# Patient Record
Sex: Female | Born: 1940 | Race: White | Hispanic: No | Marital: Married | Smoking: Never smoker
Health system: Southern US, Community
[De-identification: ages and names within clinical notes are randomized; demographics above are authoritative.]

## PROBLEM LIST (undated history)

## (undated) DIAGNOSIS — G20 Parkinson's disease: Secondary | ICD-10-CM

## (undated) DIAGNOSIS — G629 Polyneuropathy, unspecified: Secondary | ICD-10-CM

## (undated) DIAGNOSIS — R251 Tremor, unspecified: Secondary | ICD-10-CM

## (undated) DIAGNOSIS — H9390 Unspecified disorder of ear, unspecified ear: Secondary | ICD-10-CM

## (undated) DIAGNOSIS — IMO0002 Reserved for concepts with insufficient information to code with codable children: Secondary | ICD-10-CM

## (undated) DIAGNOSIS — H9190 Unspecified hearing loss, unspecified ear: Secondary | ICD-10-CM

## (undated) DIAGNOSIS — G20A1 Parkinson's disease without dyskinesia, without mention of fluctuations: Secondary | ICD-10-CM

## (undated) HISTORY — DX: Unspecified hearing loss, unspecified ear: H91.90

## (undated) HISTORY — DX: Reserved for concepts with insufficient information to code with codable children: IMO0002

## (undated) HISTORY — DX: Unspecified disorder of ear, unspecified ear: H93.90

## (undated) HISTORY — DX: Tremor, unspecified: R25.1

## (undated) HISTORY — DX: Polyneuropathy, unspecified: G62.9

---

## 1998-01-10 ENCOUNTER — Other Ambulatory Visit: Admission: RE | Admit: 1998-01-10 | Discharge: 1998-01-10 | Payer: Self-pay | Admitting: Obstetrics and Gynecology

## 1998-07-19 ENCOUNTER — Other Ambulatory Visit: Admission: RE | Admit: 1998-07-19 | Discharge: 1998-07-19 | Payer: Self-pay | Admitting: Radiology

## 1998-07-26 ENCOUNTER — Ambulatory Visit (HOSPITAL_BASED_OUTPATIENT_CLINIC_OR_DEPARTMENT_OTHER): Admission: RE | Admit: 1998-07-26 | Discharge: 1998-07-26 | Payer: Self-pay

## 2000-07-04 ENCOUNTER — Inpatient Hospital Stay (HOSPITAL_COMMUNITY): Admission: EM | Admit: 2000-07-04 | Discharge: 2000-07-14 | Payer: Self-pay | Admitting: *Deleted

## 2000-07-16 ENCOUNTER — Other Ambulatory Visit: Admission: RE | Admit: 2000-07-16 | Discharge: 2000-07-17 | Payer: Self-pay | Admitting: *Deleted

## 2000-07-17 ENCOUNTER — Inpatient Hospital Stay (HOSPITAL_COMMUNITY): Admission: EM | Admit: 2000-07-17 | Discharge: 2000-07-22 | Payer: Self-pay | Admitting: *Deleted

## 2000-07-23 ENCOUNTER — Encounter: Payer: Self-pay | Admitting: Emergency Medicine

## 2000-07-23 ENCOUNTER — Inpatient Hospital Stay: Admission: EM | Admit: 2000-07-23 | Discharge: 2000-07-29 | Payer: Self-pay | Admitting: Emergency Medicine

## 2000-07-23 ENCOUNTER — Encounter: Payer: Self-pay | Admitting: Endocrinology

## 2000-07-24 ENCOUNTER — Encounter: Payer: Self-pay | Admitting: Endocrinology

## 2000-07-26 ENCOUNTER — Encounter: Payer: Self-pay | Admitting: Internal Medicine

## 2003-02-17 ENCOUNTER — Encounter (INDEPENDENT_AMBULATORY_CARE_PROVIDER_SITE_OTHER): Payer: Self-pay | Admitting: Specialist

## 2003-02-17 ENCOUNTER — Ambulatory Visit (HOSPITAL_COMMUNITY): Admission: RE | Admit: 2003-02-17 | Discharge: 2003-02-17 | Payer: Self-pay | Admitting: Gastroenterology

## 2006-07-09 ENCOUNTER — Encounter: Admission: RE | Admit: 2006-07-09 | Discharge: 2006-07-23 | Payer: Self-pay | Admitting: Neurology

## 2008-12-20 ENCOUNTER — Encounter: Admission: RE | Admit: 2008-12-20 | Discharge: 2008-12-20 | Payer: Self-pay | Admitting: Neurology

## 2009-07-11 ENCOUNTER — Emergency Department (HOSPITAL_BASED_OUTPATIENT_CLINIC_OR_DEPARTMENT_OTHER): Admission: EM | Admit: 2009-07-11 | Discharge: 2009-07-11 | Payer: Self-pay | Admitting: Emergency Medicine

## 2009-07-11 ENCOUNTER — Ambulatory Visit: Payer: Self-pay | Admitting: Diagnostic Radiology

## 2011-02-22 NOTE — H&P (Signed)
[facility]  Patient:    Kristina Luna, Kristina Luna                         MRN: [id number] Adm. Date:  [id number] Attending:  Rogelia Boga                         History and Physical  HISTORY OF PRESENT ILLNESS:  The patient is a 70 year old white female who was admitted to the [facility] on July 17, 2000, for evaluation and treatment of bipolar disorder.  At that time she was admitted on a voluntary basis due to increasingly bizarre behavior, extreme restlessness with inability to sleep.  Prior to that admission the patient had become much more restless and unable to sit still.  She had become more anxious and irritable prior to this admission.  She was initially evaluated and stabilized at [facility] ED where she received Benadryl, Ativan, Haldol, and Cogentin.  PAST PSYCHIATRIC HISTORY:  The patient has had one previous psychiatric admission at the [facility] from September 28 through October 8 and again was admitted three days later.  Dr. Kevan Ny has been her primary care physician.  PAST MEDICAL HISTORY:  Remarkable for a history of lumbar disk disease and remote hysterectomy.  MEDICATIONS:  Prior to her July 17, 2000, admission the patient had been on Haldol, Depakote, Cogentin, Klonopin.  ALLERGIES:  Drug allergies include CODEINE and IVP DYE.  SOCIAL HISTORY:  The patient has been married for 38 years, has two adult children.  The patient has completed two years at the college level.  There has been no alcohol or drug use.  No substance abuse.  FAMILY HISTORY:  Noncontributory except for a father with a history of depression.  PHYSICAL EXAMINATION:  VITAL SIGNS:  Blood pressure was 130/80, pulse 90, respiratory rate 24, temperature 97.2.  GENERAL APPEARANCE:  Well-developed, middle-aged, slightly overweight female who is quite agitated in four-point restraints.  She continuously attempted  to sit up and move out of bed.  She was mute and noncommunicative and was unable to follow simple commands.  SKIN:  Examination revealed a fairly recent hematoma over the left frontal area.  There was scattered ecchymoses over the anterior surfaces of her legs and much less involvement of the arms.  HEENT:  Head and neck examination revealed a large 8 cm hematoma over the left frontal area.  Pupillary responses were normal.  Extraocular muscles were intact to passive head turning.  Ear, nose and throat were unremarkable. Dentition was in poor repair.  NECK:  Supple without meningismus and range of motion intact.  CHEST:  Clear anterolaterally.  CARDIOVASCULAR:  Normal S1 S2 without murmurs.  ABDOMEN:  Mildly obese, soft, nontender.  Bowel sounds were active.  EXTREMITIES:  Intact peripheral pulses.  NEUROLOGIC:  Examination revealed the patient to be quite agitated, mute. Moved all extremities without focal weakness.  No pathological reflexes could be demonstrated.  Plantar responses were flexor.  IMPRESSION: 1. Bipolar disorder. 2. Status post recent closed head injury, contusion of the scalp. 3. History of lumbar disk disease.  DISPOSITION:  Will admit the patient to the medical psych unit for further evaluation.  Will obtain a CT scan hopefully when the patient is more stable and able to cooperate to rule out the unlikely possibility of subdural hematoma.  Psychiatric service will be consulted. DD:  07/22/00 TD:  07/23/00 Job:  [id number]

## 2011-02-22 NOTE — Consult Note (Signed)
[facility]  Patient:    Kristina Luna, Kristina Luna                         MRN: [id number] Proc. Date: 07/24/00 Adm. Date:  [id number] Attending:  Rogelia Boga                          Consultation Report  DATE OF BIRTH:  02/02/1941.  REQUESTING PHYSICIAN:  Dr. Justine Null.  REASON FOR EVALUATION:  Delirium.  HISTORY OF PRESENT ILLNESS:  This is the initial inpatient consultation evaluation of this 70 year old white female with a history of bipolar disorder, who was admitted on October 16th after a recent discharge from the [facility] for increased agitation, restlessness and bizarre behavior.  The exact precipitating event of this admission is not clear to me, but she has apparently been very restless and agitated, with no sleep for the past couple of days.  She also clearly has had some sort of head trauma, with a large hematoma in her left temporal area.  The patient reports that she fell down, but is not really able to clarify things any further.  Since her admit, she has become less agitated and has slept some, but she has been confused and sometimes lethargic, precipitating this consultation.  She has undergone CT scan of the head on the 17th and 18th, as discussed below.  PAST MEDICAL HISTORY:  Remarkable for bipolar disorder with admissions to [facility] from September 28th to October 8th and again three days later; also, lumbar disk disease and remote history of hysterectomy.  FAMILY HISTORY:  Father with a history of depression; otherwise, noncontributory.  SOCIAL HISTORY:  She has been married 38 years and has two adult children. She completed two years at the college level.  No history of alcohol, drug or other substance abuse.  MEDICATIONS:  Prior to July 17, 2000 admission, the patient had been on Haldol, Depakote, Cogentin and Klonopin.  Presently, she is on Zyprexa, Depakote, Seroquel and p.r.n. Haldol and  Cogentin.  ALLERGIES:  CODEINE and IVP DYE.  REVIEW OF SYSTEMS:  Unavailable secondary to patients mental status.  PHYSICAL EXAMINATION  VITAL SIGNS:  Temperature 97.3, blood pressure 120/52, pulse 98, respirations 18.  GENERAL:  In general, she is drowsy, alerts to voice and appears to be in no evident distress.  Her speech is fairly dysarthric and sometimes inappropriate in content.  She once asked me, "Hows the baby?"  She is oriented to [facility] [place], was occasionally able to tell me it was October 2001, but at other times, told me it was January 1991.  She was able to follow one- and two-step commands.  CHEST:  Clear to auscultation.  HEART:  Regular rate and rhythm without murmurs.  NECK:  Supple without carotid bruits.  NEUROLOGIC:  Mental status as above.  Cranial nerves:  Funduscopic exam is okay on the right; the left fundus is poorly visualized.  Pupils are equal and briskly reactive.  Face, tongue and palate all move well in the midline. Motor exam:  Decreased tone throughout.  Strength exam is limited, as the patient is in four-point restraints and is intermittently cooperative but strength is probably normal.  Sensation:  Patient withdraws vigorously to pain in all extremities.  Reflexes are globally diminished throughout.  Toes are downgoing.  Cerebellar:  Rapid alternating movements and gait exam are deferred.  LABORATORY AND X-RAY FINDINGS:  CBC is remarkable for a white count of 12.4. BMET and TSH were unremarkable.  Depakote level is 87.  Blood and urine cultures are negative.  CTs of the head of October 17th and 18th are reviewed.  Both demonstrate a large left frontotemporal hematoma in the subcutaneous area on the exam of July 24, 2000.  There is a small hyperdensity in the left temporal area which most likely represents a tiny hemorrhagic contusion, with a questionable area of an epidural hematoma nearby.  IMPRESSION 1.  Status post head trauma with left temporal contusion, a small hemorrhage    and questionable epidural hematoma. 2. Persistent encephalopathy.  This is probably postconcussive secondary to    the above.  Would rule out metabolic infectious and medication causes.  If    this is postconcussive, it may take several days to clear.  RECOMMENDATIONS 1. Will repeat CT scan today.  If she is, in fact, collecting an epidural    hematoma which is increasing in size, she warrants neurosurgical    evaluation. 2. Would minimize the use of sedatives and medications for agitation.  Agree    with the use of no benzodiazepines. 3. If not clear by Monday, will check EEG. DD:  07/25/00 TD:  07/26/00 Job: [id number]

## 2011-02-22 NOTE — Discharge Summary (Signed)
[facility]  Patient:    ERYCA, BOLTE                         MRN: [id number] Adm. Date:  [id number] Disc. Date: [id number] Attending:  Rogelia Boga Dictator:   Valinda Hoar, N.P.                           Discharge Summary  HISTORY OF PRESENT ILLNESS:  Mrs. Dalby is a 70 year old white married female admitted July 17, 2000 on a voluntary basis due to bizarre behavior. The patient apparently has not slept since discharge July 14, 2000.  She has been turning her pillows over and over.  This information was obtained from the emergency department records at [facility] as well as the from the ACT Team.  Currently, the patient is very difficult to arouse, appears sedated, cannot answer any questions.  The patient was taken to [facility] ED by her family early this morning. According to her family, she has had no sleep in three days.  She presented to the emergency department restless, unable to sit still or to be still.  She requests to go to the bathroom over 30 times while at [facility].  The family reports bizarre behavior and said she was exhibiting bizarre behavior at the ED, getting on the bed on all fours, meaning her hands and feet.  The patient and family denied suicidal ideation and homicidal ideation or psychosis. The patient was oriented when awake at [facility], but was very difficult to awaken early in the morning.  Apparently she has been anxious, irritable and agitated prior to admission.  Emergency department MDs note states she was extremely restless and agitated in the ED in the bathroom.  Again, she had to be medicated while at [facility] ED, where she received Benadryl 50 mg IM at about 4 a.m., received Ativan 2 mg IM at 5:30 a.m. and received Haldol 5 mg at 6:15 and also received Cogentin 2 mg at 5:40 a.m. and received additional Haldol 5 mg at 7:45 a.m.  PAST PSYCHIATRIC HISTORY:   The patient has been  hospitalized previously at [facility] in September 28 through July 14, 2000.  She also has seen a therapist in the past, named Sylvan Cheese.  She also was treated at [facility] partial hospitalization program.  PAST MEDICAL HISTORY:  The patients primary care doctor is Dr. Kevan Ny and he has treated her with nortriptyline in the past. Medical problems include lumbar disk disease and a hysterectomy.  ADMISSION MEDICATIONS:  Haldol 2 mg 1/2 tab t.i.d. and 1-1/2 tabs q.h.s. for a total of 6 mg, De0pakote 500 mg ER 2 tabs q.h.s., Cogentin 1 mg b.i.d., Klonopin 0.5 mg t.i.d., Ogin 1.5 mg daily.  Apparently she was up to 6 mg Haldol as of July 16, 2000.  DRUG ALLERGIES:  The patient is allergic to IV DYE.  PHYSICAL EXAMINATION:  Please see physical examination done at [facility] Emergency Department.  There are no significant findings.  VITAL SIGNS:  Temperature 97.3, pulse 72, respirations 18, blood pressure 100/62.  MENTAL STATUS EXAMINATION:  On admission, difficult to assess the patient due to her sedation from medication, having no sleep for the past three days. [name] in hospital gown lying in bed snoring.  Unable to cooperate since she was too sleepy and sedated.  She was sleeping, mute,  unable to awaken patient due to her sedation.  Unable to assess her mood.  At the ED, the family and patient did say she was not suicidal or not homicidal. Sleeping, unable to assess for psychosis.  Cognitive, according to the ACT Team she was oriented when awake, however, she is unable to be assessed at this time sleeping too hard to wake up or be aroused.  ADMITTING DIAGNOSES: Axis I:    Bipolar disorder, mixed. Axis II:   None. Axis III:  1. Rule out acathisia.            2. Lumbar disk disease. Axis IV:   Severe related to mental illness. Axis V:    Current global assessment of functioning 20, highest past year 55.  LABORATORY DATA:  Done mainly at  [facility].  While at [facility] a CBC was done and was normal except for WBC elevated at 12.4, neutrophils elevated at 9.5, monos slightly elevated 0.8.  CMET was within normal limits.  Amylase within normal limits.  TSH was within normal limits.  Valproic acid level on July 18, 2000 was 32,.6, on July 20, 2000 was 65.4.  Urinalysis showed a trace of ketones, mild esterase, few epithelials and many bacteria.  There was no growth in two days.  EKG was done and no significant change from last tracing in September 1999.  HOSPITAL COURSE:  The patient was admitted to the [facility] for treatment of her "bizarre behavior".  She is a high fall risk. She was started on Haldol 5 mg q.h.s., Restoril 30 mg q.h.s., Depakote 500 mg ER 2 q.h.s., Cogentin 1 mg b.i.d., Ogin 1.5 mg q.d.  We also gave her Ativan 2 mg p.o. now for agitation. We stopped the Cogentin, Haldol on July 18, 2000 and started her on Geodon 20 mg p.o. q.a.m. and q.h.s. and gave her the first dose on July 18, 2000.  We did an EKG.  Cogentin 2 mg p.o. or IM b.i.d. p.r.n.  She was on a one-to-one up until July 18, 2000, when it was felt this could be discontinued.  On July 18, 2000 she remained psychotic, disorganized, had not been sleeping much at all for several days and did not sleep well the previous night.  She admits to hearing some voices, Still very unsteady, has difficulty following directions.  Haldol was changed to Geodon and discussed cardiac risks.  We did an EKG and increased her Depakote. On July 19, 2000 she was oriented to self but disoriented to time.  She appeared oversedated.  There was no evidence of acathisia or Parkinsons tremor.  On July 21, 2000 she was agitated and restless, sleeping poorly, noted her thoughts were not racing as much, denied hearing voices.  See note above.  Valproic acid level was checked and we tried her back on  Geodon and increased the Depakote.  On July 22, 2000, she remained quite agitated and did not sleep the night before.  Would not follow directions.  Following, she  was on a one-to-one.  She continued to be agitated, belligerent and confused, disoriented and endangering herself.  We ordered a belt restraint, given her level of lability.  On July 22, 2000 the patient had a hematoma on her left upper eye above her temple.  She had banged her head earlier and had a slight bruise that had become a large hematoma.  She was sent to [facility] ED for evaluation, suspected  of confusional state.   She was transferred to [facility] ED to evaluate hematoma and rule out concussion on July 22, 2000. She apparently was admitted to [facility] for a possible fracture. The patient was therefore discharged from our unit to a medical unit.  CONDITION ON DISCHARGE:  The patient was still rather bizarre, appeared to be psychotic, uncooperative.  She had fallen and was sent to [facility] ED for treatment.  DISPOSITION:  The patient had to be admitted to [facility] medical unit and did not return to the [facility].  FOLLOW-UP:  The patient is to follow up at [facility] when she is discharged from [facility] ED.  DISCHARGE MEDICATIONS:  None, as she was sent to [facility] ED and wsa subsequently admitted to the [facility].  Discharge medications were the same as on admission.  We had her on Geodon 20 mg p.o. q.a.m. and q.h.s., Cogentin 2 mg p.o. or IM b.i.d. p.r.n., and Seroquel 25 mg p.o. b.i.d. and 100 mg q.h.s.  We increased her Depakote to 1500 mg ER p.o. q.h.s. and changed the Geodon to 20 mg p.o. q.a.m. and q.h.s. and Seroquel was changed to 50 mg p.o. q.4h. p.r.n.  On July 22, 2000 the Depakote was discontinued due to the fall.  Gave her Ativan 2 mg p.o. or IM q.4h. p.r.n. prior to transferring her to [facility].  FINAL  DIAGNOSES: Axis I:    Bipolar disorder, mixed. Axis II:   None. Axis III:  1. Lumbar disk disease.            2. Head injury related to fall. Axis IV:   Severe related to mental illness. Axis V:    Current global assessment of functioning at discharge 30, highest            past year 62. DD:  09/02/00 TD:  09/02/00 Job: [id number]

## 2011-02-22 NOTE — Discharge Summary (Signed)
[facility]  Patient:    Kristina Luna, Kristina Luna                         MRN: [id number] Adm. Date:  07/23/00 Disc. Date: 07/29/00 Attending:  Justine Null, M.D. [facility]                           Discharge Summary  REASON FOR ADMISSION:  Bizarre behavior.  HISTORY OF PRESENT ILLNESS:  The patient is a 70 year old woman admitted by Dr. Amador Cunas on July 23, 2000, with bizarre behavior.  Please refer to his dictated history and physical for details.  HOSPITAL COURSE:  The patient was admitted and treated with sedatives.  For the first several days of her hospitalization, there was no improvement. Therefore, neurology was consulted and they felt she had postconcussion syndrome.  They recommended continuing supportive care.  In fact, this did result in steady improvement in the patients symptoms.  By July 29, 2000, the patient was alert, oriented, ambulatory, and eating her usual diet.  Also during her hospitalization, she required p.r.n. bronchodilators for some wheezing which improved.  She also had a CT scan showing some subcutaneous bleeding at the left temporal area of the head.  She was discharged home in good condition on July 29, 2000.  DISCHARGE MEDICATIONS: 1. Zyprexa 10 mg q.h.s. 2. Depakote ER 1000 mg q.h.s. 3. Seroquel 100 mg q.h.s. and 25 mg b.i.d. during the day. 4. Cogentin 1 mg b.i.d. p.r.n. abnormal movements. 5. Zyprexa also 10 mg q.4h. p.r.n. anxiety.  ACTIVITY:  No restrictions.  DIET:  No restrictions.  FOLLOWUP:  Dr. Kevan Ny within 30 days.  Also with Dr. Claudette Head on July 30, 2000, at 3:45 p.m.  DISCHARGE DIAGNOSES: 1. Contusion to the head. 2. Postconcussion syndrome, resolved. 3. History of bipolar effective disorder. 4. Some wheezing during hospitalization, resolved. DD:  09/10/00 TD:  09/10/00 Job: [id number]

## 2011-02-22 NOTE — Op Note (Signed)
   Kristina Luna, Kristina Luna                            ACCOUNT NO.:  [id number]   MEDICAL RECORD NO.:  [id number]                   PATIENT TYPE:  AMB   LOCATION:  ENDO                                 [facility]ACILITY[facility]  [facility]   PHYSICIAN:  John C. Madilyn Fireman, M.D.                 DATE OF BIRTH:  Jun 21, 1941   DATE OF PROCEDURE:  02/17/2003  DATE OF DISCHARGE:                                 OPERATIVE REPORT   PROCEDURE:  Colonoscopy.   INDICATION FOR PROCEDURE:  Family history of colon polyps in a first-degree  relative.   DESCRIPTION OF PROCEDURE:  The patient was placed in the left lateral  decubitus position and placed on the pulse monitor with continuous low-flow  oxygen delivered by nasal cannula.  She was sedated with 100 mcg IV fentanyl  and 7 mg IV Versed.  The Olympus video colonoscope was inserted into the  rectum and advanced to the cecum, confirmed by transillumination at  McBurney's point and visualization of the ileocecal valve and appendiceal  orifice.  The prep was excellent.  The cecum, ascending, transverse, and  descending colon all appeared normal with no masses, polyps, diverticula, or  other mucosal abnormalities.  Within the sigmoid colon, there were seen a  few scattered diverticula.  At the rectosigmoid junction, there was a 6 mm  polyp which was fulgurated by hot biopsy with the remaining rectum appearing  normal.  The scope was then withdrawn and the patient returned to the  recovery room in stable condition.  She tolerated the procedure well, and  there were no immediate complications.   IMPRESSION:  1. Left-sided diverticulosis.  2. Small rectosigmoid polyp.   PLAN:  Await biopsy results.                                               John C. Madilyn Fireman, M.D.    [facility]/MEDQ  D:  02/17/2003  T:  02/17/2003  Job:  [id number]   cc:   Duncan Dull, M.D.  [address]  [place]  [place] [address]  Fax: [phone]

## 2011-02-22 NOTE — H&P (Signed)
[facility]  Patient:    DRESDEN, AMENT                         MRN: [id number] Adm. Date:  [id number] Attending:  Otilio Saber                         History and Physical  IDENTIFYING DATA:  Ms. Josie Dixon is a 70 year old white married female who was admitted under commitment via [facility] Emergency Department with an history of increasing agitation and psychosis.  HISTORY OF PRESENT ILLNESS:  The patient had become increasingly agitated and psychotic over the past week.  She has had auditory or visual hallucinations, racing thoughts, religious preoccupation, grandiose thinking, labile moods, paranoia, and markedly decreased sleep.  She had been caring for an elderly disabled aunt and it is unclear whether she had become overwhelmed.  She had recently been started on nortriptyline for depression.  When seen in the emergency department, she became markedly agitated and had to be restrained.  PAST PSYCHIATRIC HISTORY:  The patient denied any previous psychiatric evaluation or treatment.  It was noted that she had been treated recently by her primary care physician, Dr. Kevan Ny, with nortriptyline 25 to 50 mg q.h.s. She had also seen a therapist, Ihor Dow, in the recent past.  PAST MEDICAL HISTORY:  The patient is followed by Dr. Kevan Ny at [facility].  She has history of lumbar disk disease and has had a hysterectomy.  She has been on Ogen 1.5 mg q.d. and nortriptyline 25 mg q.h.s. She reported being allergic to CODEINE.  SOCIAL HISTORY:  The patient has been married for about 38 years.  She has two adult children.  She completed high school and had two years at [employer].  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  She denied any history of drug or alcohol abuse.  FAMILY HISTORY:  The patient reported that her father had a history  of depression.  REVIEW OF SYSTEMS AND PHYSICAL EXAMINATION:  Performed at [facility] Emergency Department.  There were no significant findings.  MENTAL STATUS EXAMINATION:  The patient presented as a older white female wearing a hospital gown.  She is rather disheveled and agitated restless.  She has difficulty sitting still during our interview.  Speech is very rambling. Thought process show loose associations, racing thoughts, ideas of reference, and auditory and visual hallucinations, as well as some paranoid ideations. Oriented to person, place, and June 25, 2000.  She named the Architect.  She could spell "world" forward and backward although somewhat slowly.  She remembered two objects at five minutes, could remember 3 of 3 with some prompting.  ADMITTING DIAGNOSES: Axis I:    Bipolar disorder, mixed. Axis II:   No diagnosis. Axis III:  Lumbar disk disease. Axis IV:   Psychosocial stressors: Severe. Axis V:    Global assessment of functioning current 30, highest past year 65.  TREATMENT PLAN:  The patient will be treated with a combination of Haldol and Depakote. DD:  07/05/00 TD:  07/05/00 Job: [id number]

## 2011-02-22 NOTE — H&P (Signed)
[facility]  Patient:    Kristina Luna, Kristina Luna                         MRN: [id number] Adm. Date:  [id number] Attending:  Otilio Saber Dictator:   Valinda Hoar, N.P.                         History and Physical  IDENTIFYING INFORMATION:  Mrs. Levitan is a 70 year old white married female admitted July 17, 2000 on a voluntary basis due to bizarre behavior, not sleeping since discharge on July 14, 2000 and turning her pillows over and over.  This information is obtained from the emergency department records at [facility] as well as from the ACT team.  Currently, the patient is very difficult to arouse, appears sedated and cannot answer any questions.  HISTORY OF PRESENT ILLNESS:  The patient was taken to [facility] ED by her family early this morning.  According to her family, she has had no sleep in three days.  She presented to the emergency department very restless, unable to sit still or to be still.  She requested to go to the bathroom over 30 times while at [facility].  Her family reports bizarre behavior and says she is exhibiting bizarre behavior at the ED, getting on the bed on all fours, meaning hands and feet.  The patient and family denied suicidal ideation and homicidal ideation or psychosis.  The patient was oriented when awake at [facility] but was very difficult to awake later in the morning.  Apparently she had been anxious, very irritable and agitated prior to admission.  The ED MDs note states she was extremely restless and agitated and again in the ED, restless, pacing up and down the hall, unable to be still, going in and out of the bathroom.  She had to be medicated while at [facility] ED.  She received Benadryl 50 mg IM at about 4:12 a.m.  She received Ativan 2 mg IM at 5:30 a.m. She received Haldol 5 mg at 6:15 and also received Cogentin 2 mg at 5:40 a.m. and received additional Haldol 5 mg at 7:45 a.m.  PAST PSYCHIATRIC  HISTORY:  The patient has only had one previous psychiatric hospitalization and that was here at [facility] July 04, 2000 through July 14, 2000.  Apparently, she had seen a therapist in the past named Sylvan Cheese.  She was at [facility] from July 04, 2000 through July 14, 2000 and was being treated in the partial hospitalization program. In the past, Dr. Kevan Ny, her primary carer doctor had treated her with nortriptyline 25 mg to 50 mg at h.s. with Ogen 1.5 mg q.d. prior to July 04, 2000.  She was attending the partial program until her admission this morning.  PAST MEDICAL HISTORY:  The patients primary medical care doctor is Dr. Kevan Ny, ______ [facility].  Medical problems include LUmbar disk disease and hysterectomy.  ADMISSION MEDICATIONS:  Haldol 2 mg 1/2 tab t.i.d. and 1-1/2 tabs q.h.s. for a total of 6 mg, Depakote 500 mg ER 2 tabs q.h.s., Cogentin 1 mg b.i.d., Klonopin 0.5 mg t.i.d., Ogen 1.5 mg daily.  Apparently she was up to 6 mg of Haldol as of October 10. 2001.  DRUG ALLERGIES:  CODEINE, IV DYE.  SOCIAL HISTORY:  The patient has been married for 38 years, two adult children.  She completed high school  and two years at [employer]. She was working for an Scientist, research (physical sciences) in the past.  FAMILY HISTORY:  Father apparently had a history of depression.  ALCOHOL AND DRUG HISTORY:  The patient denies alcohol abuse, no substance abuse.  POSITIVE PHYSICAL FINDINGS:  Please see physical examination done at [facility] Emergency Department.  There were no significant findings. Her temperature is 97.3, pulse 72, respirations 18, blood pressure 100/62.  MENTAL STATUS EXAMINATION:  Currently, it was very difficult to assess the patient due to her sedation from her medication and having no sleep for the past three days.  Appearance:  [name] in a hospital gown lying in bed snoring.  Was unable to cooperate since she was  too sleepy.  Speech: She was sleeping, mute, unable to awaken patient to talk due to her sedation. Mood:  Sleeping, unable to assess.  At the ED, the family and patient did say she was not suicidal or homicidal.  Thought process:  She is sleeping, currently unable to assess for psychosis.  Cognitive:  According to ACT team, she was oriented if awake, however, she is unable to assess at this tie since she is sleeping and hard to wake up.  CURRENT DIAGNOSES: Axis I:    Bipolar disorder, mixed. Axis II:   None. Axis III:  1. Rule out acathisia.            2. Lumbar disk disease. Axis IV:   Severe related to mental illness. Axis V:    Current global assessment of functioning 20. highest in past year            is 65.  TREATMENT PLAN AND RECOMMENDATION:  Voluntary admission to [facility].  Will check patient every 15 minutes, maintain her safety. She is a very high fall risk currently and needs to be assisted with ambulation while sedated.  Force fluids while awake.  At this point in time will hold medication for now until she is less sedated and awake.  TENTATIVE LENGTH OF STAY AND DISCHARGE PLAN:  3-5 days. DD:  07/17/00 TD:  07/18/00 Job: [id number]

## 2011-02-22 NOTE — Discharge Summary (Signed)
[facility]  Patient:    Kristina Luna, Kristina Luna                         MRN: [id number] Adm. Date:  [id number] Disc. Date: [id number] Attending:  Rogelia Boga                           Discharge Summary  BRIEF HISTORY:  Kristina Luna is a 70 year old white married female admitted under commitment from the [facility] Emergency Department with a history of increasing agitation and psychosis.  She had become increasingly agitated and psychotic over the week prior to admission.  She had auditory and visual hallucinations, racing thoughts, religious preoccupation, grandiose thinking, labile moods, paranoia, and markedly decreased sleep.  She had recently been started on nortriptyline for depression.  When seen in the emergency department, she was markedly agitated and had to be restrained.  PAST PSYCHIATRIC HISTORY:  The patient denied previous psychiatric evaluation or treatment.  She had been treated recently by her primary care physician, Dr. gates with nortriptyline 25-50 mg q.h.s.  She has also seeing a therapist, Sylvan Cheese in the recent past.  As noted, she was followed medically by Dr. Kevan Ny.  She had a history of lumbar disk disease.  She had a hysterectomy in the past.  MEDICATIONS:  At the time of admission she was on Ogin 1.5 mg p.o. q.d. and nortriptyline 25 mg p.o. q.h.s.  ALLERGIES:  She reported being allergic to CODEINE.  PHYSICAL EXAMINATION:  Physical examination was performed at [facility] Emergency Department with no significant findings.  ADMISSION MENTAL STATUS EXAMINATION:  [name] wearing a hospital gown.  She was rather disheveled and was quite agitated ad restless.  She had difficulty sitting still during the interview.  Speech was very rambling. Thought processes showed loose associations, racing thoughts, ideas of reference, auditory and visual hallucinations and paranoid ideations.  She had n o suicidal  ideation.  She was oriented x 3.  She could name the president and vice president.  She could spell the word WORLD forward and backwards, although somewhat slowly.  She could remember 2 of 3 objects at five minutes.  ADMITTING DIAGNOSES: Axis I:    Bipolar disorder, mixed. Axis II:   No diagnosis. Axis III:  Lumbar disk disease. Axis IV:   Psychosocial stressors severe. Axis V:    Global assessment of functioning current 30, highest past year 65.  LABORATORY FINDINGS:  Blood chemistries were normal.  Valproic acid level was 86.4 on July 10, 2000.  HOSPITAL COURSE:  The patient was admitted to the [facility] for treatment of her psychosis and mood lability.  She was treated with a combination of Haldol and Depakote.  She remained quite psychotic with thought blocking, disorganized thinking and auditory hallucinations.  We increased her Haldol and added p.r.n. Ativan.  The patient continued to have some thought blocking and paranoid ideation with racing thoughts but was a little bit more coherent.  She developed some EPS.  We elected to discontinue her p.r.n. Ativan as it seemed to agitate her.  She remained quite anxious and continued to hear some voice, but overall was less paranoid and disorganized.  We elected to add Klonopin and were able to decrease her Haldol . She was still somewhat disorganized and confused but denied hearing further voices.  It was thought she could be managed in a less  intensive setting and arrangements were made for follow-up in the partial program.  CONDITION ON DISCHARGE:  The patient was discharged in moderately improved condition with alleviation of auditory hallucinations, decrease in disorganization of her thinking and decrease in her paranoia.  DISPOSITION:  The patient was discharged home and is to follow up in the [facility] partial hospitalization program on July 15, 2000.  DISCHARGE MEDICATIONS:  Haldol 1 mg bi.d and 6 mg q.h.s.,  Depakote 1000 mg ER q.h.s., Cogentin 1 mg p.o. b.i.d., Klonopin 0.5 mg t.i.d. and Ogin 1.5 mg p.o. q.d.  FINAL DIAGNOSES: Axis I:    Bipolar disorder, mixed. Axis II:   No diagnosis. Axis III:  Lumbar disk diseased. Axis IV:   Psychosocial stressors severe. Axis V:    Global assessment of functioning current 48, highest past year 65. DD:  08/25/00 TD:  08/25/00 Job: [id number]

## 2011-02-22 NOTE — Discharge Summary (Signed)
[facility]  Patient:    Kristina Luna, Kristina Luna                         MRN: [id number] Adm. Date:  [id number] Disc. Date: [id number] Attending:  Rogelia Boga Dictator:   Candi Leash. Orsini, N.P.                           Discharge Summary  NO REPORT. DD:  09/02/00 TD:  09/02/00 Job: [id number]

## 2011-04-16 ENCOUNTER — Other Ambulatory Visit: Payer: Self-pay | Admitting: Family Medicine

## 2011-04-22 ENCOUNTER — Ambulatory Visit
Admission: RE | Admit: 2011-04-22 | Discharge: 2011-04-22 | Disposition: A | Discharge status: Home / Self Care | Payer: Medicare Other | Source: Ambulatory Visit | Attending: Family Medicine | Admitting: Family Medicine

## 2011-05-22 HISTORY — PX: UPPER GI ENDOSCOPY: SHX6162

## 2011-05-27 ENCOUNTER — Other Ambulatory Visit (HOSPITAL_COMMUNITY): Payer: Self-pay | Admitting: Gastroenterology

## 2011-05-27 DIAGNOSIS — R1310 Dysphagia, unspecified: Secondary | ICD-10-CM

## 2011-06-04 ENCOUNTER — Ambulatory Visit (HOSPITAL_COMMUNITY)
Admission: RE | Admit: 2011-06-04 | Discharge: 2011-06-04 | Disposition: A | Discharge status: Home / Self Care | Payer: Medicare Other | Source: Ambulatory Visit | Attending: Gastroenterology | Admitting: Gastroenterology

## 2011-06-04 DIAGNOSIS — R1319 Other dysphagia: Secondary | ICD-10-CM | POA: Insufficient documentation

## 2011-06-04 DIAGNOSIS — R1310 Dysphagia, unspecified: Secondary | ICD-10-CM | POA: Insufficient documentation

## 2011-06-06 ENCOUNTER — Ambulatory Visit: Payer: Medicare Other | Attending: Family Medicine | Admitting: *Deleted

## 2011-06-06 DIAGNOSIS — R269 Unspecified abnormalities of gait and mobility: Secondary | ICD-10-CM | POA: Insufficient documentation

## 2011-06-06 DIAGNOSIS — R42 Dizziness and giddiness: Secondary | ICD-10-CM | POA: Insufficient documentation

## 2011-06-06 DIAGNOSIS — IMO0001 Reserved for inherently not codable concepts without codable children: Secondary | ICD-10-CM | POA: Insufficient documentation

## 2011-06-11 ENCOUNTER — Ambulatory Visit: Payer: Medicare Other | Attending: Family Medicine | Admitting: Physical Therapy

## 2011-06-11 DIAGNOSIS — R269 Unspecified abnormalities of gait and mobility: Secondary | ICD-10-CM | POA: Insufficient documentation

## 2011-06-11 DIAGNOSIS — R42 Dizziness and giddiness: Secondary | ICD-10-CM | POA: Insufficient documentation

## 2011-06-11 DIAGNOSIS — IMO0001 Reserved for inherently not codable concepts without codable children: Secondary | ICD-10-CM | POA: Insufficient documentation

## 2011-06-18 ENCOUNTER — Ambulatory Visit: Payer: Medicare Other | Admitting: Physical Therapy

## 2011-06-19 ENCOUNTER — Ambulatory Visit: Payer: Medicare Other | Admitting: Physical Therapy

## 2011-06-24 ENCOUNTER — Ambulatory Visit: Payer: Medicare Other | Admitting: Physical Therapy

## 2011-06-27 ENCOUNTER — Ambulatory Visit: Payer: Medicare Other | Admitting: Physical Therapy

## 2011-07-01 ENCOUNTER — Ambulatory Visit: Payer: Medicare Other | Admitting: Physical Therapy

## 2011-07-02 ENCOUNTER — Ambulatory Visit: Payer: Medicare Other | Admitting: Physical Therapy

## 2011-07-09 ENCOUNTER — Other Ambulatory Visit: Payer: Self-pay | Admitting: Diagnostic Neuroimaging

## 2011-07-09 DIAGNOSIS — M545 Low back pain: Secondary | ICD-10-CM

## 2011-07-09 DIAGNOSIS — R42 Dizziness and giddiness: Secondary | ICD-10-CM

## 2011-07-11 ENCOUNTER — Ambulatory Visit: Payer: Medicare Other | Attending: Family Medicine | Admitting: Physical Therapy

## 2011-07-11 DIAGNOSIS — R42 Dizziness and giddiness: Secondary | ICD-10-CM | POA: Insufficient documentation

## 2011-07-11 DIAGNOSIS — IMO0001 Reserved for inherently not codable concepts without codable children: Secondary | ICD-10-CM | POA: Insufficient documentation

## 2011-07-11 DIAGNOSIS — R269 Unspecified abnormalities of gait and mobility: Secondary | ICD-10-CM | POA: Insufficient documentation

## 2011-07-12 ENCOUNTER — Ambulatory Visit
Admission: RE | Admit: 2011-07-12 | Discharge: 2011-07-12 | Disposition: A | Discharge status: Home / Self Care | Payer: Medicare Other | Source: Ambulatory Visit | Attending: Diagnostic Neuroimaging | Admitting: Diagnostic Neuroimaging

## 2011-07-12 ENCOUNTER — Ambulatory Visit: Payer: Medicare Other | Admitting: Physical Therapy

## 2011-07-12 DIAGNOSIS — M545 Low back pain: Secondary | ICD-10-CM

## 2011-07-12 DIAGNOSIS — R42 Dizziness and giddiness: Secondary | ICD-10-CM

## 2011-11-15 ENCOUNTER — Other Ambulatory Visit: Payer: Self-pay | Admitting: Diagnostic Neuroimaging

## 2011-11-15 DIAGNOSIS — M549 Dorsalgia, unspecified: Secondary | ICD-10-CM

## 2011-11-18 ENCOUNTER — Telehealth: Payer: Self-pay | Admitting: Radiology

## 2011-11-18 NOTE — Telephone Encounter (Signed)
Unable to decipher if  Pt has an allergy to contrast, no allergies were noted in her chart. Will go into old system to see if I can find out.dd  Unable to get into imagecast.

## 2011-11-21 ENCOUNTER — Ambulatory Visit
Admission: RE | Admit: 2011-11-21 | Discharge: 2011-11-21 | Disposition: A | Discharge status: Home / Self Care | Payer: Medicare Other | Source: Ambulatory Visit | Attending: Diagnostic Neuroimaging | Admitting: Diagnostic Neuroimaging

## 2011-11-21 DIAGNOSIS — M549 Dorsalgia, unspecified: Secondary | ICD-10-CM

## 2011-11-21 DIAGNOSIS — M5126 Other intervertebral disc displacement, lumbar region: Secondary | ICD-10-CM

## 2011-11-21 NOTE — Discharge Instructions (Signed)

## 2013-04-22 ENCOUNTER — Telehealth: Payer: Self-pay | Admitting: *Deleted

## 2013-04-22 NOTE — Telephone Encounter (Signed)
LMVM home # to return call about appt.

## 2013-04-27 NOTE — Telephone Encounter (Signed)
Spoke to husband and appt made for 05-11-13 at 1330. ssy

## 2013-05-11 ENCOUNTER — Encounter: Payer: Self-pay | Admitting: Diagnostic Neuroimaging

## 2013-05-11 ENCOUNTER — Ambulatory Visit (INDEPENDENT_AMBULATORY_CARE_PROVIDER_SITE_OTHER): Payer: Medicare Other | Admitting: Diagnostic Neuroimaging

## 2013-05-11 VITALS — BP 109/67 | HR 91 | Temp 98.5°F | Ht 67.0 in | Wt 108.0 lb

## 2013-05-11 DIAGNOSIS — R9089 Other abnormal findings on diagnostic imaging of central nervous system: Secondary | ICD-10-CM

## 2013-05-11 DIAGNOSIS — R269 Unspecified abnormalities of gait and mobility: Secondary | ICD-10-CM

## 2013-05-11 DIAGNOSIS — R930 Abnormal findings on diagnostic imaging of skull and head, not elsewhere classified: Secondary | ICD-10-CM

## 2013-05-11 DIAGNOSIS — R413 Other amnesia: Secondary | ICD-10-CM

## 2013-05-11 NOTE — Progress Notes (Signed)
[facility]  PATIENT: Kristina Luna DOB: Sep 16, 1941  REFERRING CLINICIAN:  HISTORY FROM: patient and husband REASON FOR VISIT: follow up   HISTORICAL  CHIEF COMPLAINT:  Chief Complaint  Patient presents with  . Follow-up    speech, swallowing, dizziness, hearing loss, incontinence    HISTORY OF PRESENT ILLNESS:   UPDATE 05/11/13: Since last visit, more progression of memory loss, stuttering, swallow difficulty. Gait is poor. More incontinence. Husband inquiring about NPH as a possibility.  UPDATE 09/22/12: Since last visit, progression of swallow diff (in spite of esophageal dilation). Speech is stuttering, worse with anxiety. LBP stable. Not taking advil or tylenol. Rates pain as 7-8 out of 10.   UPDATE 03/10/12: Had epidural after last visit but not really beneficial. She intermittently does vestibular rehab exercises. Continues with intermittent vertigo. Mild cognitive impairment stable, MMSE 29/30 today. AFT 13. Has soft diet since continued problems with swallowing, esophagus was stretched in the past. Ambulating with a cane, no recent falls.   UPDATE 11/13/11: Last seen by Dr. Marjory Lies who ordered MRI of the brain and Lspine. MRI of the brain with mild periventricular and subcortical SVD and moderate bitemporal atrophy. MRI of the lumbar spine with disc bulging at L3-4 with potential impingement upon the exiting L3 nerve root. L4-L5 facet hypertrophy with no spinal stenosis,L5 laminectomy on the left. In comparison to MRI from 12/20/08 there is no significant interval change. Performing vestibular exercises daily and much less vertigo. Continues with swallowing problems, has seen GI,  on soft diet. Continues with lumbar back pain and wants epidural injection. Husband also concerned about mild cognitive impairment, has hx of bipolar disease.   UPDATE 07/08/11: Still c/o vertigo; also with right lower back pain.  Was eval'd for swallowing diff by GI, had espohageal  dilation, and still has prob.  Also with dry mouth.  PRIOR HPI (07/24/10): 72 year old white female who  has history of peripheral neuropathy, numbness, and back pain.  She was on gabapentin but had drowsiness,  her psychiatrist, Dr. Chancy Hurter had taken her off of that.  She returns today for followup. She was last seen 01/23/2010. She says her neuropathy symptoms are about the same.  She also complains with some dizziness, she has been seen by ENT in [facility], has been to vestibular rehab. She intermittently does her vestibular rehab exercises, says she was more "faithful until several weeks ago then slacked off". Back pain is the same as previous visit, repear MRI of the lumbar spine after her last visit showed DDD,and prominent facet arthropathy, stable post op changes at left L5 without compression.  She has had no falls since April of 2010.   She does not use an assistive device.  She has significant anxiety and depression and treated by Dr. Everlena Cooper, psychiatrist.  She has had recent changes to her meds by Dr Jennelle Human.  She has a bipolar disorder as well. No new neurological complaints. See ROS.  REVIEW OF SYSTEMS: Full 14 system review of systems performed and notable only for fatigue palpitation hearing loss ringing in ears spinning sensation trouble swallowing incontinence shortness of breath headache slurred speech difficulty swallowing dizziness decreased energy.  ALLERGIES: Allergies  Allergen Reactions  . Conray (Iothalamate Sodium) Shortness Of Breath    Ionic contrast.  Patient DOES tolerate Iohexol and other non-ionic contrast agents.    . Codeine   . Shellfish Allergy     HOME MEDICATIONS: Outpatient Prescriptions Prior to Visit  Medication Sig Dispense Refill  .  aspirin 81 MG tablet Take 81 mg by mouth daily.      . clonazePAM (KLONOPIN) 0.5 MG tablet Take 0.5 mg by mouth 4 (four) times daily.      . Cyanocobalamin (VITAMIN B 12 PO) Take 500 mg by mouth.      . polyethylene  glycol powder (MIRALAX) powder Take 17 g by mouth daily.      . raloxifene (EVISTA) 60 MG tablet Take 60 mg by mouth daily.      . QUEtiapine (SEROQUEL) 50 MG tablet Take 50 mg by mouth at bedtime.      . B Complex Vitamins (B COMPLEX PO) Take 500 mcg by mouth daily.      . Cholecalciferol (CVS VITAMIN D3 PO) Take 500 mg by mouth daily.      . divalproex (DEPAKOTE ER) 250 MG 24 hr tablet Take 250 mg by mouth daily.      . Multiple Vitamin (MULTIVITAMIN) capsule Take 1 capsule by mouth daily.      . Omega-3 Fatty Acids (CVS NATURAL FISH OIL) 1200 MG CAPS Take 1,200 mg by mouth daily.       No facility-administered medications prior to visit.    PAST MEDICAL HISTORY: Past Medical History  Diagnosis Date  . Ear problem     inner  . Peripheral neuropathy   . Degenerative disc disease   . Tremor     swallowing problems w/ esophageal stretching  . Hearing loss     w/aids    PAST SURGICAL HISTORY: Past Surgical History  Procedure Laterality Date  . Upper gi endoscopy  05/22/2011    FAMILY HISTORY: Family History  Problem Relation Age of Onset  . Alzheimer's disease Mother   . Seizures Mother   . Brain cancer Father     tumor  . Cancer Father     SOCIAL HISTORY:  History   Social History  . Marital Status: Married    Spouse Name: Dorinda Hill    Number of Children: 2  . Years of Education: 67yrs   Occupational History  . Retired    Social History Main Topics  . Smoking status: Never Smoker   . Smokeless tobacco: Never Used  . Alcohol Use: No     Comment: very little occasionally  . Drug Use: No  . Sexually Active: Not on file   Other Topics Concern  . Not on file   Social History Narrative   Patient lives at home with her spouse.    Caffeine Use: none; quit 76yrs ago     PHYSICAL EXAM  Filed Vitals:   05/11/13 1341  BP: 109/67  Pulse: 91  Temp: 98.5 F (36.9 C)  TempSrc: Oral  Height: 5\' 7"  (1.702 m)  Weight: 108 lb (48.988 kg)    Not recorded     Body mass index is 16.91 kg/(m^2).  GENERAL EXAM: Patient is in no distress; DURING EVALUATION, PATIENT BEGAN TO PANIC AND HAD DIFF CONTROLLING HER MUCUS/SALIVA. SHE USES NAPKINS TO WIPE MUCUS FROM HER TONGUE.  CARDIOVASCULAR: Regular rate and rhythm, no murmurs, no carotid bruits  NEUROLOGIC: MENTAL STATUS: MILD OROLINGUAL DYSKINESIAS. STUTTERING, PRESSURED SPEECH. Awake, alert, comprehension intact. ABLE TO READ. POSITIVE SNOUT AND MYERSONS. MMSE 20/30. MOCA 15/30.  CRANIAL NERVE: no papilledema on fundoscopic exam, pupils equal and reactive to light, visual fields full to confrontation, extraocular muscles intact, no nystagmus, facial sensation and strength symmetric, uvula midline, shoulder shrug symmetric, tongue midline. MODERATE-SEVERE DYSARTHRIA. MOTOR: POSTURAL TREMOR IN BUE. MILD  COGWHEELING IN BUE. MOD BRADYKINESIA. Normal bulk and tone, full strength in the BUE, BLE SENSORY: normal and symmetric to light touch COORDINATION: finger-nose-finger, fine finger movements normal REFLEXES: deep tendon reflexes present and symmetric GAIT/STATION: SLOW TO RISE. SHORT UNSTEADY STEPS. NO ARM SWING. EN BLOC TURNING.   DIAGNOSTIC DATA (LABS, IMAGING, TESTING) - I reviewed patient records, labs, notes, testing and imaging myself where available.  No results found for this basename: WBC, HGB, HCT, MCV, PLT   No results found for this basename: na, k, cl, co2, glucose, bun, creatinine, calcium, prot, albumin, ast, alt, alkphos, bilitot, gfrnonaa, gfraa   No results found for this basename: CHOL, HDL, LDLCALC, LDLDIRECT, TRIG, CHOLHDL   No results found for this basename: HGBA1C   No results found for this basename: VITAMINB12   No results found for this basename: TSH    07/12/11 MRI brain - mild periventricular and subcortical small vessel ischemic disease and moderate bitemporal atrophy. Moderate ventriculomegaly.  07/12/11 MRI lumbar spine - disc bulging at L3-4 with potential  impingement upon the exiting L3 nerve root. L4-L5 facet hypertrophy with no spinal stenosis, L5 laminectomy on the left. In comparison to MRI from 12/20/08 there is no significant interval change.    ASSESSMENT AND PLAN  72 y.o. female with intermittent vertigo (unknown etiology) and low back pain.  Also with MCI, positive snout, positive myersons sign, dysphagia, and moderate bitemporal brain atrophy.   DDX: alzheimers, dementia with lewy bodies, PSP, MSA, normal pressure hydrocephalus   05/11/13: Timed up and go (76ft) - (38, 30, 33 secs) 25 ft walk - (29, 25 secs) MMSE - 20/30   PLAN: 1. Repeat MRI brain 2. Consider large volume LP (30-87ml) and post-LP gait and cognitive evaluation   Orders Placed This Encounter  Procedures  . MR Brain W Wo Contrast     Meds ordered this encounter  Medications  . Valproic Acid (DEPAKENE) 250 MG/5ML SYRP syrup    Sig: Take 5 mLs by mouth. 2 doses  . Iron-Vitamins (GERITOL) LIQD    Sig: Take by mouth.  . QUEtiapine (SEROQUEL) 25 MG tablet    Sig: Take 25 mg by mouth at bedtime.     Suanne Marker, MD 05/11/2013, 2:37 PM Certified in Neurology, Neurophysiology and Neuroimaging  [facility] [address], [address] [place], [place] [address] [phone]

## 2013-05-19 DIAGNOSIS — R269 Unspecified abnormalities of gait and mobility: Secondary | ICD-10-CM

## 2013-05-20 ENCOUNTER — Other Ambulatory Visit: Payer: Self-pay | Admitting: Diagnostic Neuroimaging

## 2013-05-20 DIAGNOSIS — R9089 Other abnormal findings on diagnostic imaging of central nervous system: Secondary | ICD-10-CM

## 2013-05-20 DIAGNOSIS — R413 Other amnesia: Secondary | ICD-10-CM

## 2013-05-20 DIAGNOSIS — R269 Unspecified abnormalities of gait and mobility: Secondary | ICD-10-CM

## 2013-05-24 ENCOUNTER — Telehealth: Payer: Self-pay | Admitting: Diagnostic Neuroimaging

## 2013-05-26 ENCOUNTER — Telehealth: Payer: Self-pay

## 2013-05-26 NOTE — Telephone Encounter (Signed)
Message copied by [facility] on Wed May 26, 2013  2:31 PM ------      Message from: Seth Bake      Created: Wed May 26, 2013  9:29 AM       Calling again for MRI results.  Has Dr. Marjory Lies looked at the scan.  Please try to have someone call today.  ------

## 2013-05-26 NOTE — Telephone Encounter (Signed)
I called and let patient's husband know that, per Dr. Marjory Lies, there have been no changes since last MRI.   Husband had a few questions for Dr. Marjory Lies:  1.  The MRI was performed without contrast. Do you want it done again with contrast? 2. Do you want to do an LP? 3. Do you want to start patient on medications for Parkinson's Disease? 4. When would you like next office visit scheduled, based on MRI results and these questions?

## 2013-06-10 ENCOUNTER — Telehealth: Payer: Self-pay | Admitting: Diagnostic Neuroimaging

## 2013-06-10 NOTE — Telephone Encounter (Signed)
Patient requesting to know if LP should be done and when should next OV be scheduled.

## 2013-06-16 ENCOUNTER — Telehealth: Payer: Self-pay | Admitting: Diagnostic Neuroimaging

## 2013-06-16 NOTE — Telephone Encounter (Signed)
Patient requesting to know if LP needs to be done.

## 2013-06-21 ENCOUNTER — Telehealth: Payer: Self-pay | Admitting: Diagnostic Neuroimaging

## 2013-06-22 NOTE — Telephone Encounter (Signed)
Called patient. Spoke to spouse. Went over MRI results. Spouse requesting to know next step.

## 2013-08-02 ENCOUNTER — Telehealth: Payer: Self-pay | Admitting: Diagnostic Neuroimaging

## 2013-08-02 NOTE — Telephone Encounter (Signed)
Spoke with Dr. Kevan Ny and patient's husband. Patient started on carb/levo 2-3 weeks ago. Not much response on half tab TID. MRI results reviews. 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  PLAN: 1. Continue up to 1 tab TID, x 4-6 weeks, then follow up in clinic.   Suanne Marker, MD 08/02/2013, 4:43 PM Certified in Neurology, Neurophysiology and Neuroimaging  [facility] [address], [address] [place], [place] [address] [phone]

## 2013-08-02 NOTE — Telephone Encounter (Signed)
Scheduled and confirmed 4-6 wk appt w/ patient per husband

## 2013-08-23 ENCOUNTER — Encounter: Payer: Self-pay | Admitting: Diagnostic Neuroimaging

## 2013-08-23 ENCOUNTER — Ambulatory Visit (INDEPENDENT_AMBULATORY_CARE_PROVIDER_SITE_OTHER): Payer: Medicare Other | Admitting: Diagnostic Neuroimaging

## 2013-08-23 VITALS — BP 120/66 | HR 88 | Temp 98.8°F | Ht 67.0 in | Wt 111.0 lb

## 2013-08-23 DIAGNOSIS — R413 Other amnesia: Secondary | ICD-10-CM

## 2013-08-23 NOTE — Progress Notes (Signed)
[facility]  PATIENT: Kristina Luna DOB: [birth year]  REFERRING CLINICIAN:  HISTORY FROM: patient and husband REASON FOR VISIT: follow up   HISTORICAL  CHIEF COMPLAINT:  Chief Complaint  Patient presents with  . Follow-up    gait difficulty, memory loss    HISTORY OF PRESENT ILLNESS:   UPDATE 08/23/13: Since last visit, memory is stable. Incontinence is worse. Gait is stable (still unsteady, short steps). Has been on carb/levo x 5 weeks. Perhaps some improvement in "demeanor" but not affecting gait or memory.  UPDATE 05/11/13: Since last visit, more progression of memory loss, stuttering, swallow difficulty. Gait is poor. More incontinence. Husband inquiring about NPH as a possibility.  UPDATE 09/22/12: Since last visit, progression of swallow diff (in spite of esophageal dilation). Speech is stuttering, worse with anxiety. LBP stable. Not taking advil or tylenol. Rates pain as 7-8 out of 10.   UPDATE 03/10/12: Had epidural after last visit but not really beneficial. She intermittently does vestibular rehab exercises. Continues with intermittent vertigo. Mild cognitive impairment stable, MMSE 29/30 today. AFT 13. Has soft diet since continued problems with swallowing, esophagus was stretched in the past. Ambulating with a cane, no recent falls.   UPDATE 11/13/11: Last seen by Dr. Marjory Lies who ordered MRI of the brain and Lspine. MRI of the brain with mild periventricular and subcortical SVD and moderate bitemporal atrophy. MRI of the lumbar spine with disc bulging at L3-4 with potential impingement upon the exiting L3 nerve root. L4-L5 facet hypertrophy with no spinal stenosis,L5 laminectomy on the left. In comparison to MRI from 12/20/08 there is no significant interval change. Performing vestibular exercises daily and much less vertigo. Continues with swallowing problems, has seen GI,  on soft diet. Continues with lumbar back pain and wants epidural injection. Husband  also concerned about mild cognitive impairment, has hx of bipolar disease.   UPDATE 07/08/11: Still c/o vertigo; also with right lower back pain.  Was eval'd for swallowing diff by GI, had espohageal dilation, and still has prob.  Also with dry mouth.  PRIOR HPI (07/24/10): 72 year old white female who  has history of peripheral neuropathy, numbness, and back pain.  She was on gabapentin but had drowsiness,  her psychiatrist, Dr. Chancy Hurter had taken her off of that.  She returns today for followup. She was last seen 01/23/2010. She says her neuropathy symptoms are about the same.  She also complains with some dizziness, she has been seen by ENT in [facility], has been to vestibular rehab. She intermittently does her vestibular rehab exercises, says she was more "faithful until several weeks ago then slacked off". Back pain is the same as previous visit, repear MRI of the lumbar spine after her last visit showed DDD,and prominent facet arthropathy, stable post op changes at left L5 without compression.  She has had no falls since April of 2010.   She does not use an assistive device.  She has significant anxiety and depression and treated by Dr. Everlena Cooper, psychiatrist.  She has had recent changes to her meds by Dr Jennelle Human.  She has a bipolar disorder as well. No new neurological complaints. See ROS.  REVIEW OF SYSTEMS: Full 14 system review of systems performed and notable only for hearing loss ringing in ears trouble swallowing incontinence slurred speech difficulty swallowing dizziness.    ALLERGIES: Allergies  Allergen Reactions  . Conray [Iothalamate Sodium] Shortness Of Breath    Ionic contrast.  Patient DOES tolerate Iohexol and other non-ionic contrast agents.    Marland Kitchen  Codeine   . Shellfish Allergy     HOME MEDICATIONS: Outpatient Prescriptions Prior to Visit  Medication Sig Dispense Refill  . aspirin 81 MG tablet Take 81 mg by mouth daily.      . clonazePAM (KLONOPIN) 0.5 MG tablet Take 0.5 mg  by mouth 4 (four) times daily.      . Cyanocobalamin (VITAMIN B 12 PO) Take 500 mg by mouth.      . Iron-Vitamins (GERITOL) LIQD Take by mouth.      . polyethylene glycol powder (MIRALAX) powder Take 17 g by mouth daily.      . QUEtiapine (SEROQUEL) 25 MG tablet Take 25 mg by mouth at bedtime.      . raloxifene (EVISTA) 60 MG tablet Take 60 mg by mouth daily.      . Valproic Acid (DEPAKENE) 250 MG/5ML SYRP syrup Take 5 mLs by mouth. 2 doses       No facility-administered medications prior to visit.    PAST MEDICAL HISTORY: Past Medical History  Diagnosis Date  . Ear problem     inner  . Peripheral neuropathy   . Degenerative disc disease   . Tremor     swallowing problems w/ esophageal stretching  . Hearing loss     w/aids    PAST SURGICAL HISTORY: Past Surgical History  Procedure Laterality Date  . Upper gi endoscopy  05/22/2011    FAMILY HISTORY: Family History  Problem Relation Age of Onset  . Alzheimer's disease Mother   . Seizures Mother   . Brain cancer Father     tumor  . Cancer Father     SOCIAL HISTORY:  History   Social History  . Marital Status: Married    Spouse Name: Dorinda Hill    Number of Children: 2  . Years of Education: 52yrs   Occupational History  . Retired    Social History Main Topics  . Smoking status: Never Smoker   . Smokeless tobacco: Never Used  . Alcohol Use: No     Comment: very little occasionally  . Drug Use: No  . Sexual Activity: Not on file   Other Topics Concern  . Not on file   Social History Narrative   Patient lives at home with her spouse.    Caffeine Use: none; quit 2yrs ago     PHYSICAL EXAM  Filed Vitals:   08/23/13 1043  BP: 120/66  Pulse: 88  Temp: 98.8 F (37.1 C)  TempSrc: Oral  Height: 5\' 7"  (1.702 m)  Weight: 111 lb (50.349 kg)    Not recorded    Body mass index is 17.38 kg/(m^2).  GENERAL EXAM: Patient is in no distress.  CARDIOVASCULAR: Regular rate and rhythm, no murmurs, no  carotid bruits  NEUROLOGIC: MENTAL STATUS: INTERMITTENT SPONTANEOUS SNOUT REFLEX AND OROLINGUAL DYSKINESIAS. STUTTERING, PRESSURED SPEECH. SOFT VOICE. Awake, alert, comprehension intact. ABLE TO READ. POSITIVE SNOUT AND MYERSONS. MMSE 25/30. GDS 4. CRANIAL NERVE: pupils equal and reactive to light, visual fields full to confrontation, extraocular muscles intact, no nystagmus, facial sensation and strength symmetric, uvula midline, shoulder shrug symmetric, tongue midline. MODERATE-SEVERE DYSARTHRIA. MOTOR: NO TREMOR. NO RIGIDITY. MILD COGWHEELING. MOD BRADYKINESIA (LUE >> RUE). Normal bulk and tone, full strength in the BUE, BLE SENSORY: normal and symmetric to light touch COORDINATION: finger-nose-finger, fine finger movements normal REFLEXES: deep tendon reflexes present and symmetric GAIT/STATION: SLOW TO RISE. SHORT UNSTEADY STEPS. NO ARM SWING. EN BLOC TURNING. 25 FT WALK (20s, 21s).   DIAGNOSTIC DATA (  LABS, IMAGING, TESTING) - I reviewed patient records, labs, notes, testing and imaging myself where available.  No results found for this basename: WBC,  HGB,  HCT,  MCV,  PLT   No results found for this basename: na,  k,  cl,  co2,  glucose,  bun,  creatinine,  calcium,  prot,  albumin,  ast,  alt,  alkphos,  bilitot,  gfrnonaa,  gfraa   No results found for this basename: CHOL,  HDL,  LDLCALC,  LDLDIRECT,  TRIG,  CHOLHDL   No results found for this basename: HGBA1C   No results found for this basename: VITAMINB12   No results found for this basename: TSH    07/12/11 MRI brain - mild periventricular and subcortical small vessel ischemic disease and moderate bitemporal atrophy. Moderate ventriculomegaly.  07/12/11 MRI lumbar spine - disc bulging at L3-4 with potential impingement upon the exiting L3 nerve root. L4-L5 facet hypertrophy with no spinal stenosis, L5 laminectomy on the left. In comparison to MRI from 12/20/08 there is no significant interval change.   05/20/13 MRI brain  (without) demonstrating: 1. Moderate perisylvian and mesial temporal atrophy. 2. Moderate ventriculomegaly. 3. Mild periventricular and subcortical chronic small vessel ischemic disease. 4. No change from MRI on 07/12/11.    ASSESSMENT AND PLAN  72 y.o. female with intermittent vertigo (unknown etiology) and low back pain.  Also with MCI, positive snout, positive myersons sign, dysphagia, and moderate bitemporal brain atrophy. Suspect neurodegenerative process.  Ddx: alzheimers, dementia with lewy bodies, PSP, MSA, normal pressure hydrocephalus   05/11/13 (baseline) Timed up and go (40ft) - (38, 30, 33 secs) 25 ft walk - (29, 25 secs) MMSE - 20/30  08/23/13 (on carb/levo x 5 weeks) 25 ft walk - (20, 21 secs) MMSE - 25/30   PLAN: 1. Will check large volume LP (30-66ml) and post-LP gait and cognitive evaluation   Orders Placed This Encounter  Procedures  . DG FLUORO GUIDE LUMBAR PUNCTURE   Return in about 2 weeks (around 09/06/2013) for same day as lumbar puncture (week of Sep 06, 2013).    Suanne Marker, MD 08/23/2013, 11:57 AM Certified in Neurology, Neurophysiology and Neuroimaging  [facility] [address] [place], [place] [address] [phone]

## 2013-08-30 ENCOUNTER — Telehealth: Payer: Self-pay | Admitting: Diagnostic Neuroimaging

## 2013-08-30 NOTE — Telephone Encounter (Signed)
Pt  Will have LP done at [facility] at 0830 on 09-06-13.  To be over here at around 1000 for gait.  If needed will use empty room to lie down.

## 2013-09-06 ENCOUNTER — Encounter: Payer: Self-pay | Admitting: Diagnostic Neuroimaging

## 2013-09-06 ENCOUNTER — Ambulatory Visit
Admission: RE | Admit: 2013-09-06 | Discharge: 2013-09-06 | Disposition: A | Discharge status: Home / Self Care | Payer: Medicare Other | Source: Ambulatory Visit | Attending: Diagnostic Neuroimaging | Admitting: Diagnostic Neuroimaging

## 2013-09-06 ENCOUNTER — Ambulatory Visit (INDEPENDENT_AMBULATORY_CARE_PROVIDER_SITE_OTHER): Payer: Medicare Other | Admitting: Diagnostic Neuroimaging

## 2013-09-06 VITALS — BP 128/60 | HR 65

## 2013-09-06 VITALS — BP 128/62 | HR 62 | Ht 67.0 in

## 2013-09-06 DIAGNOSIS — R269 Unspecified abnormalities of gait and mobility: Secondary | ICD-10-CM

## 2013-09-06 DIAGNOSIS — R413 Other amnesia: Secondary | ICD-10-CM

## 2013-09-06 DIAGNOSIS — R9089 Other abnormal findings on diagnostic imaging of central nervous system: Secondary | ICD-10-CM

## 2013-09-06 DIAGNOSIS — R930 Abnormal findings on diagnostic imaging of skull and head, not elsewhere classified: Secondary | ICD-10-CM

## 2013-09-06 LAB — CSF CELL COUNT WITH DIFFERENTIAL: Tube #: 2

## 2013-09-06 LAB — PROTEIN, CSF: Total Protein, CSF: 38 mg/dL (ref 15–45)

## 2013-09-06 LAB — GLUCOSE, CSF: Glucose, CSF: 61 mg/dL (ref 43–76)

## 2013-09-06 MED ORDER — CARBIDOPA-LEVODOPA 25-100 MG PO TABS: 1.0000 | ORAL_TABLET | Freq: Three times a day (TID) | ORAL | Status: DC

## 2013-09-06 NOTE — Patient Instructions (Signed)
Continue observation over next few days / weeks.  Continue carb/levo.

## 2013-09-06 NOTE — Progress Notes (Signed)
[facility]  PATIENT: Kristina Luna DOB: [birth year]  REFERRING CLINICIAN:  HISTORY FROM: patient and husband REASON FOR VISIT: follow up   HISTORICAL  CHIEF COMPLAINT:  Chief Complaint  Patient presents with  . Follow-up    memory    HISTORY OF PRESENT ILLNESS:   UPDATE 09/06/13: Patient had lumbar puncture by Dr. Alfredo Batty [facility]) this morning. Opening pressure was 8 cm water. 32 cc CSF was removed. Patient already procedure well. Patient comes to office for further evaluation. She feels somewhat tired and dizzy. She has not eaten any breakfast morning. We gave patient some juice to drink and then had her tested with MMSE and 25 foot walk test.  UPDATE 08/23/13: Since last visit, memory is stable. Incontinence is worse. Gait is stable (still unsteady, short steps). Has been on carb/levo x 5 weeks. Perhaps some improvement in "demeanor" but not affecting gait or memory.  UPDATE 05/11/13: Since last visit, more progression of memory loss, stuttering, swallow difficulty. Gait is poor. More incontinence. Husband inquiring about NPH as a possibility.  UPDATE 09/22/12: Since last visit, progression of swallow diff (in spite of esophageal dilation). Speech is stuttering, worse with anxiety. LBP stable. Not taking advil or tylenol. Rates pain as 7-8 out of 10.   UPDATE 03/10/12: Had epidural after last visit but not really beneficial. She intermittently does vestibular rehab exercises. Continues with intermittent vertigo. Mild cognitive impairment stable, MMSE 29/30 today. AFT 13. Has soft diet since continued problems with swallowing, esophagus was stretched in the past. Ambulating with a cane, no recent falls.   UPDATE 11/13/11: Last seen by Dr. Marjory Lies who ordered MRI of the brain and Lspine. MRI of the brain with mild periventricular and subcortical SVD and moderate bitemporal atrophy. MRI of the lumbar spine with disc bulging at L3-4 with potential  impingement upon the exiting L3 nerve root. L4-L5 facet hypertrophy with no spinal stenosis,L5 laminectomy on the left. In comparison to MRI from 12/20/08 there is no significant interval change. Performing vestibular exercises daily and much less vertigo. Continues with swallowing problems, has seen GI,  on soft diet. Continues with lumbar back pain and wants epidural injection. Husband also concerned about mild cognitive impairment, has hx of bipolar disease.   UPDATE 07/08/11: Still c/o vertigo; also with right lower back pain.  Was eval'd for swallowing diff by GI, had espohageal dilation, and still has prob.  Also with dry mouth.  PRIOR HPI (07/24/10): 72 year old white female who  has history of peripheral neuropathy, numbness, and back pain.  She was on gabapentin but had drowsiness,  her psychiatrist, Dr. Chancy Hurter had taken her off of that.  She returns today for followup. She was last seen 01/23/2010. She says her neuropathy symptoms are about the same.  She also complains with some dizziness, she has been seen by ENT in [facility], has been to vestibular rehab. She intermittently does her vestibular rehab exercises, says she was more "faithful until several weeks ago then slacked off". Back pain is the same as previous visit, repear MRI of the lumbar spine after her last visit showed DDD,and prominent facet arthropathy, stable post op changes at left L5 without compression.  She has had no falls since April of 2010.   She does not use an assistive device.  She has significant anxiety and depression and treated by Dr. Everlena Cooper, psychiatrist.  She has had recent changes to her meds by Dr Jennelle Human.  She has a bipolar disorder as  well. No new neurological complaints. See ROS.  REVIEW OF SYSTEMS: Full 14 system review of systems performed and notable only for hearing loss ringing in ears trouble swallowing incontinence slurred speech difficulty swallowing dizziness.    ALLERGIES: Allergies  Allergen  Reactions  . Conray [Iothalamate Sodium] Shortness Of Breath    Ionic contrast.  Patient DOES tolerate Iohexol and other non-ionic contrast agents.    . Codeine   . Shellfish Allergy     HOME MEDICATIONS: Outpatient Prescriptions Prior to Visit  Medication Sig Dispense Refill  . aspirin 81 MG tablet Take 81 mg by mouth daily.      . clonazePAM (KLONOPIN) 0.5 MG tablet Take 0.5 mg by mouth 4 (four) times daily.      . Cyanocobalamin (VITAMIN B 12 PO) Take 500 mg by mouth.      . Iron-Vitamins (GERITOL) LIQD Take by mouth.      . oxybutynin (DITROPAN) 5 MG tablet Take 1 tablet by mouth 2 (two) times daily.      . polyethylene glycol powder (MIRALAX) powder Take 17 g by mouth daily.      . QUEtiapine (SEROQUEL) 25 MG tablet Take 25 mg by mouth at bedtime.      . raloxifene (EVISTA) 60 MG tablet Take 60 mg by mouth daily.      . Valproic Acid (DEPAKENE) 250 MG/5ML SYRP syrup Take 5 mLs by mouth. 2 doses      . carbidopa-levodopa (SINEMET IR) 25-100 MG per tablet Take 1 tablet by mouth 3 (three) times daily.       No facility-administered medications prior to visit.    PAST MEDICAL HISTORY: Past Medical History  Diagnosis Date  . Ear problem     inner  . Peripheral neuropathy   . Degenerative disc disease   . Tremor     swallowing problems w/ esophageal stretching  . Hearing loss     w/aids    PAST SURGICAL HISTORY: Past Surgical History  Procedure Laterality Date  . Upper gi endoscopy  05/22/2011    FAMILY HISTORY: Family History  Problem Relation Age of Onset  . Alzheimer's disease Mother   . Seizures Mother   . Brain cancer Father     tumor  . Cancer Father     SOCIAL HISTORY:  History   Social History  . Marital Status: Married    Spouse Name: Dorinda Hill    Number of Children: 2  . Years of Education: 68yrs   Occupational History  . Retired    Social History Main Topics  . Smoking status: Never Smoker   . Smokeless tobacco: Never Used  . Alcohol Use: No       Comment: very little occasionally  . Drug Use: No  . Sexual Activity: Not on file   Other Topics Concern  . Not on file   Social History Narrative   Patient lives at home with her spouse.    Caffeine Use: none; quit 31yrs ago     PHYSICAL EXAM  Filed Vitals:   09/06/13 0920  Height: 5\' 7"  (1.702 m)    Not recorded    There is no weight on file to calculate BMI.  GENERAL EXAM: Patient is in no distress.  CARDIOVASCULAR: Regular rate and rhythm, no murmurs, no carotid bruits  NEUROLOGIC: MENTAL STATUS: SOFT VOICE. HESITANT SPEECH. Awake, alert, comprehension intact. POSITIVE SNOUT AND MYERSONS. MMSE 21/30. CRANIAL NERVE: pupils equal and reactive to light, visual fields full to confrontation, extraocular  muscles intact, no nystagmus, facial sensation and strength symmetric, uvula midline, shoulder shrug symmetric, tongue midline. MODERATE-SEVERE DYSARTHRIA. MOTOR: NO TREMOR. NO RIGIDITY. MILD COGWHEELING. MOD BRADYKINESIA (LUE >> RUE). Normal bulk and tone, full strength in the BUE, BLE SENSORY: normal and symmetric to light touch COORDINATION: finger-nose-finger, fine finger movements normal REFLEXES: deep tendon reflexes present and symmetric GAIT/STATION: SLOW TO RISE. SHORT UNSTEADY STEPS. NO ARM SWING. EN BLOC TURNING. 25 FT WALK, with cane (31s, 30s).   DIAGNOSTIC DATA (LABS, IMAGING, TESTING) - I reviewed patient records, labs, notes, testing and imaging myself where available.  No results found for this basename: WBC,  HGB,  HCT,  MCV,  PLT   No results found for this basename: na,  k,  cl,  co2,  glucose,  bun,  creatinine,  calcium,  prot,  albumin,  ast,  alt,  alkphos,  bilitot,  gfrnonaa,  gfraa   No results found for this basename: CHOL,  HDL,  LDLCALC,  LDLDIRECT,  TRIG,  CHOLHDL   No results found for this basename: HGBA1C   No results found for this basename: VITAMINB12   No results found for this basename: TSH    07/12/11 MRI brain - mild  periventricular and subcortical small vessel ischemic disease and moderate bitemporal atrophy. Moderate ventriculomegaly.  07/12/11 MRI lumbar spine - disc bulging at L3-4 with potential impingement upon the exiting L3 nerve root. L4-L5 facet hypertrophy with no spinal stenosis, L5 laminectomy on the left. In comparison to MRI from 12/20/08 there is no significant interval change.   05/20/13 MRI brain (without) demonstrating: 1. Moderate perisylvian and mesial temporal atrophy. 2. Moderate ventriculomegaly. 3. Mild periventricular and subcortical chronic small vessel ischemic disease. 4. No change from MRI on 07/12/11.   ASSESSMENT AND PLAN  72 y.o. female with intermittent vertigo (unknown etiology) and low back pain.  Also with MCI, positive snout, positive myersons sign, dysphagia, and moderate bitemporal brain atrophy. Suspect neurodegenerative process.  Ddx: alzheimers vs dementia with lewy bodies   03/10/12 (baseline) MMSE 29/30, AFT 13  05/11/13 (baseline) Timed up and go (34ft) - (38, 30, 33 secs) 25 ft walk - (29, 25 secs) MMSE - 20/30  08/23/13 (on carb/levo x 5 weeks) 25 ft walk - (20, 21 secs) MMSE - 25/30  09/06/13 (s/p LP this AM, still on carb/levo) 25 ft walk - (31, 30 secs) MMSE - 21/30, AFT 10   PLAN: 1. Continue carb/levo 2. Observation over next 1 week to see if gait/memory improve   No orders of the defined types were placed in this encounter.   Return in about 3 months (around 12/05/2013).    Suanne Marker, MD 09/06/2013, 10:10 AM Certified in Neurology, Neurophysiology and Neuroimaging  [facility] [address], [address] [place], [place] [address] [phone]

## 2013-09-06 NOTE — Progress Notes (Signed)
Discharge instructions explained to patient and her husband who is at her side after LP.  jkl

## 2013-09-06 NOTE — Telephone Encounter (Signed)
Cell count CSF being faxed. Stat.

## 2013-09-09 LAB — CSF CULTURE W GRAM STAIN: Organism ID, Bacteria: NO GROWTH

## 2013-12-07 ENCOUNTER — Encounter: Payer: Self-pay | Admitting: Diagnostic Neuroimaging

## 2013-12-07 ENCOUNTER — Ambulatory Visit (INDEPENDENT_AMBULATORY_CARE_PROVIDER_SITE_OTHER): Payer: Medicare Other | Admitting: Diagnostic Neuroimaging

## 2013-12-07 VITALS — BP 121/72 | HR 78 | Temp 98.1°F | Ht 67.0 in | Wt 110.5 lb

## 2013-12-07 DIAGNOSIS — R159 Full incontinence of feces: Secondary | ICD-10-CM

## 2013-12-07 DIAGNOSIS — F0390 Unspecified dementia without behavioral disturbance: Secondary | ICD-10-CM

## 2013-12-07 DIAGNOSIS — R269 Unspecified abnormalities of gait and mobility: Secondary | ICD-10-CM

## 2013-12-07 DIAGNOSIS — F319 Bipolar disorder, unspecified: Secondary | ICD-10-CM | POA: Insufficient documentation

## 2013-12-07 DIAGNOSIS — R413 Other amnesia: Secondary | ICD-10-CM

## 2013-12-07 DIAGNOSIS — F03A0 Unspecified dementia, mild, without behavioral disturbance, psychotic disturbance, mood disturbance, and anxiety: Secondary | ICD-10-CM

## 2013-12-07 NOTE — Progress Notes (Addendum)
[facility]  PATIENT: Kristina Luna DOB: October 22, 1940  REFERRING CLINICIAN:  HISTORY FROM: patient and husband REASON FOR VISIT: follow up   HISTORICAL  CHIEF COMPLAINT:  Chief Complaint  Patient presents with  . Follow-up    gait, memory loss    HISTORY OF PRESENT ILLNESS:   UPDATE 12/07/13: Since last visit, patient has developed fecal incontinence. Husband states this started 24 hours after lumbar puncture. They thought this was a viral infection initially. Jan 2015, they contacted PCP, who tried lomotil, with minimal benefit. Patient having loose stools, 2-5 x per day. When she takes lomotil, may have no BM in a day. Husband is managing her toileting and cleanup by himself, except 1 day per week they have a home aid to give him relief. She drinks 4 cans ensure per day, supplemented with puree fruit and vegetables. Otherwise, memory and gait are progresively worsening.  UPDATE 09/06/13: Patient had lumbar puncture by Dr. Alfredo Batty [facility]) this morning. Opening pressure was 8 cm water. 32 cc CSF was removed. Patient already procedure well. Patient comes to office for further evaluation. She feels somewhat tired and dizzy. She has not eaten any breakfast morning. We gave patient some juice to drink and then had her tested with MMSE and 25 foot walk test.  UPDATE 08/23/13: Since last visit, memory is stable. Incontinence is worse. Gait is stable (still unsteady, short steps). Has been on carb/levo x 5 weeks. Perhaps some improvement in "demeanor" but not affecting gait or memory.  UPDATE 05/11/13: Since last visit, more progression of memory loss, stuttering, swallow difficulty. Gait is poor. More incontinence. Husband inquiring about NPH as a possibility.  UPDATE 09/22/12: Since last visit, progression of swallow diff (in spite of esophageal dilation). Speech is stuttering, worse with anxiety. LBP stable. Not taking advil or tylenol. Rates pain as 7-8 out  of 10.   UPDATE 03/10/12: Had epidural after last visit but not really beneficial. She intermittently does vestibular rehab exercises. Continues with intermittent vertigo. Mild cognitive impairment stable, MMSE 29/30 today. AFT 13. Has soft diet since continued problems with swallowing, esophagus was stretched in the past. Ambulating with a cane, no recent falls.   UPDATE 11/13/11: Last seen by Dr. Marjory Lies who ordered MRI of the brain and Lspine. MRI of the brain with mild periventricular and subcortical SVD and moderate bitemporal atrophy. MRI of the lumbar spine with disc bulging at L3-4 with potential impingement upon the exiting L3 nerve root. L4-L5 facet hypertrophy with no spinal stenosis,L5 laminectomy on the left. In comparison to MRI from 12/20/08 there is no significant interval change. Performing vestibular exercises daily and much less vertigo. Continues with swallowing problems, has seen GI,  on soft diet. Continues with lumbar back pain and wants epidural injection. Husband also concerned about mild cognitive impairment, has hx of bipolar disease.   UPDATE 07/08/11: Still c/o vertigo; also with right lower back pain.  Was eval'd for swallowing diff by GI, had espohageal dilation, and still has prob.  Also with dry mouth.  PRIOR HPI (07/24/10): 73 year old white female who  has history of peripheral neuropathy, numbness, and back pain.  She was on gabapentin but had drowsiness,  her psychiatrist, Dr. Chancy Hurter had taken her off of that.  She returns today for followup. She was last seen 01/23/2010. She says her neuropathy symptoms are about the same.  She also complains with some dizziness, she has been seen by ENT in [facility], has been to vestibular rehab.  She intermittently does her vestibular rehab exercises, says she was more "faithful until several weeks ago then slacked off". Back pain is the same as previous visit, repear MRI of the lumbar spine after her last visit showed DDD,and prominent  facet arthropathy, stable post op changes at left L5 without compression.  She has had no falls since April of 2010.   She does not use an assistive device.  She has significant anxiety and depression and treated by Dr. Everlena Cooper, psychiatrist.  She has had recent changes to her meds by Dr Jennelle Human.  She has a bipolar disorder as well. No new neurological complaints. See ROS.  REVIEW OF SYSTEMS: Full 14 system review of systems performed and notable only for hearing loss ringing in ears trouble swallowing fecal and urinary incontinence slurred speech difficulty swallowing dizziness.    ALLERGIES: Allergies  Allergen Reactions  . Conray [Iothalamate Sodium] Shortness Of Breath    Ionic contrast.  Patient DOES tolerate Iohexol and other non-ionic contrast agents.    . Codeine   . Shellfish Allergy     HOME MEDICATIONS: Outpatient Prescriptions Prior to Visit  Medication Sig Dispense Refill  . aspirin 81 MG tablet Take 81 mg by mouth daily.      . carbidopa-levodopa (SINEMET IR) 25-100 MG per tablet Take 1 tablet by mouth 3 (three) times daily.  90 tablet  6  . clonazePAM (KLONOPIN) 0.5 MG tablet Take 0.5 mg by mouth 4 (four) times daily.      . Cyanocobalamin (VITAMIN B 12 PO) Take 500 mg by mouth.      . Iron-Vitamins (GERITOL) LIQD Take by mouth.      . QUEtiapine (SEROQUEL) 25 MG tablet Take 25 mg by mouth at bedtime.      . raloxifene (EVISTA) 60 MG tablet Take 60 mg by mouth daily.      . Valproic Acid (DEPAKENE) 250 MG/5ML SYRP syrup Take 5 mLs by mouth. 2 doses      . oxybutynin (DITROPAN) 5 MG tablet Take 1 tablet by mouth 2 (two) times daily.      . polyethylene glycol powder (MIRALAX) powder Take 17 g by mouth daily.       No facility-administered medications prior to visit.    PAST MEDICAL HISTORY: Past Medical History  Diagnosis Date  . Ear problem     inner  . Peripheral neuropathy   . Degenerative disc disease   . Tremor     swallowing problems w/ esophageal stretching   . Hearing loss     w/aids    PAST SURGICAL HISTORY: Past Surgical History  Procedure Laterality Date  . Upper gi endoscopy  05/22/2011    FAMILY HISTORY: Family History  Problem Relation Age of Onset  . Alzheimer's disease Mother   . Seizures Mother   . Brain cancer Father     tumor  . Cancer Father     SOCIAL HISTORY:  History   Social History  . Marital Status: Married    Spouse Name: Dorinda Hill    Number of Children: 2  . Years of Education: 7yrs   Occupational History  . Retired    Social History Main Topics  . Smoking status: Never Smoker   . Smokeless tobacco: Never Used  . Alcohol Use: No     Comment: very little occasionally  . Drug Use: No  . Sexual Activity: Not on file   Other Topics Concern  . Not on file   Social History Narrative  Patient lives at home with her spouse.    Caffeine Use: none; quit 3348yrs ago     PHYSICAL EXAM  Filed Vitals:   12/07/13 0818  BP: 121/72  Pulse: 78  Temp: 98.1 F (36.7 C)  TempSrc: Oral  Height: 5\' 7"  (1.702 m)  Weight: 110 lb 8 oz (50.122 kg)    Not recorded    Body mass index is 17.3 kg/(m^2).  GENERAL EXAM: Patient is in no distress.  CARDIOVASCULAR: Regular rate and rhythm, no murmurs, no carotid bruits  NEUROLOGIC: MENTAL STATUS: SOFT VOICE. HESITANT/STUTTERING SPEECH. Awake, alert, comprehension intact. POSITIVE SNOUT AND MYERSONS. MMSE 20/30. AFT 5. GDS 7. CRANIAL NERVE: pupils equal and reactive to light, visual fields full to confrontation, extraocular muscles intact, no nystagmus, facial sensation and strength symmetric, uvula midline, shoulder shrug symmetric, tongue midline. MODERATE-SEVERE DYSARTHRIA. MOTOR: MIXED TREMOR (POSTURAL, ACTION > REST), MILD COGWHEELING. MOD BRADYKINESIA (LLE > RLE; >> BUE). Normal bulk and tone, full strength in the BUE, BLE SENSORY: normal and symmetric to light touch COORDINATION: BUE INTENTION TREMOR REFLEXES: BUE 1, KNEES TRACE, ANKLES 0 GAIT/STATION:  SLOW TO RISE. SHORT UNSTEADY STEPS. NO ARM SWING. EN BLOC TURNING.   DIAGNOSTIC DATA (LABS, IMAGING, TESTING) - I reviewed patient records, labs, notes, testing and imaging myself where available.  No results found for this basename: WBC,  HGB,  HCT,  MCV,  PLT   No results found for this basename: na,  k,  cl,  co2,  glucose,  bun,  creatinine,  calcium,  prot,  albumin,  ast,  alt,  alkphos,  bilitot,  gfrnonaa,  gfraa   No results found for this basename: CHOL,  HDL,  LDLCALC,  LDLDIRECT,  TRIG,  CHOLHDL   No results found for this basename: HGBA1C   No results found for this basename: VITAMINB12   No results found for this basename: TSH    07/12/11 MRI brain - mild periventricular and subcortical small vessel ischemic disease and moderate bitemporal atrophy. Moderate ventriculomegaly.  07/12/11 MRI lumbar spine - disc bulging at L3-4 with potential impingement upon the exiting L3 nerve root. L4-L5 facet hypertrophy with no spinal stenosis, L5 laminectomy on the left. In comparison to MRI from 12/20/08 there is no significant interval change.   05/20/13 MRI brain (without) demonstrating: 1. Moderate perisylvian and mesial temporal atrophy. 2. Moderate ventriculomegaly. 3. Mild periventricular and subcortical chronic small vessel ischemic disease. 4. No change from MRI on 07/12/11.   ASSESSMENT AND PLAN  73 y.o. female with intermittent vertigo (unknown etiology) and low back pain.  Also with memory loss, positive snout, positive myersons sign, dysphagia, and moderate bitemporal brain atrophy. Suspect underlying neurodegenerative process.  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 Reduce carb/levo to see if GI symptoms improve 2. MRI brain and lumbar spine (eval fecal incontinence)   Orders Placed This Encounter  Procedures  . MR Brain Wo Contrast  . MR Lumbar Spine Wo Contrast   Return in about 4 months (around 04/08/2014).    Suanne MarkerVIKRAM R. PENUMALLI, MD 12/07/2013, 9:29 AM Certified in  Neurology, Neurophysiology and Neuroimaging  [facility] [address], [place], [place] [address] [phone]

## 2013-12-07 NOTE — Patient Instructions (Signed)
Try reducing carbidopa/levodopa to half tab three times per day.

## 2013-12-15 ENCOUNTER — Other Ambulatory Visit: Payer: Medicare Other

## 2013-12-20 ENCOUNTER — Ambulatory Visit
Admission: RE | Admit: 2013-12-20 | Discharge: 2013-12-20 | Disposition: A | Discharge status: Home / Self Care | Payer: Medicare Other | Source: Ambulatory Visit | Attending: Diagnostic Neuroimaging | Admitting: Diagnostic Neuroimaging

## 2013-12-20 DIAGNOSIS — R269 Unspecified abnormalities of gait and mobility: Secondary | ICD-10-CM

## 2013-12-20 DIAGNOSIS — R159 Full incontinence of feces: Secondary | ICD-10-CM

## 2013-12-20 DIAGNOSIS — F03A0 Unspecified dementia, mild, without behavioral disturbance, psychotic disturbance, mood disturbance, and anxiety: Secondary | ICD-10-CM

## 2013-12-20 DIAGNOSIS — F0390 Unspecified dementia without behavioral disturbance: Secondary | ICD-10-CM

## 2013-12-20 DIAGNOSIS — R413 Other amnesia: Secondary | ICD-10-CM

## 2013-12-20 DIAGNOSIS — F319 Bipolar disorder, unspecified: Secondary | ICD-10-CM

## 2013-12-25 ENCOUNTER — Encounter: Payer: Self-pay | Admitting: Diagnostic Neuroimaging

## 2014-03-07 ENCOUNTER — Encounter: Payer: Self-pay | Admitting: Diagnostic Neuroimaging

## 2014-04-11 ENCOUNTER — Ambulatory Visit (INDEPENDENT_AMBULATORY_CARE_PROVIDER_SITE_OTHER): Payer: Medicare Other | Admitting: Diagnostic Neuroimaging

## 2014-04-11 ENCOUNTER — Encounter: Payer: Self-pay | Admitting: Diagnostic Neuroimaging

## 2014-04-11 VITALS — BP 110/67 | HR 83 | Ht 66.0 in | Wt 109.2 lb

## 2014-04-11 DIAGNOSIS — F0280 Dementia in other diseases classified elsewhere without behavioral disturbance: Secondary | ICD-10-CM

## 2014-04-11 DIAGNOSIS — G3183 Dementia with Lewy bodies: Principal | ICD-10-CM

## 2014-04-11 MED ORDER — CARBIDOPA-LEVODOPA 25-100 MG PO TABS: 1.0000 | ORAL_TABLET | Freq: Three times a day (TID) | ORAL | Status: DC

## 2014-04-11 NOTE — Patient Instructions (Signed)
Continue carb/levodopa.  Physical and speech therapy.  Use rolling walker.

## 2014-04-11 NOTE — Progress Notes (Signed)
[facility]  PATIENT: Kristina Luna DOB: [birth year]  REFERRING CLINICIAN:  HISTORY FROM: patient and husband REASON FOR VISIT: follow up   HISTORICAL  CHIEF COMPLAINT:  Chief Complaint  Patient presents with  . Follow-up    gait, memory    HISTORY OF PRESENT ILLNESS:   UPDATE 04/11/14: Since last visit, has transitioned to assisted living. Memory, balance, incontinence have progressed. More language, speech diff. More swallow difficulty.   UPDATE 12/07/13: Since last visit, patient has developed fecal incontinence. Husband states this started 24 hours after lumbar puncture. They thought this was a viral infection initially. Jan 2015, they contacted PCP, who tried lomotil, with minimal benefit. Patient having loose stools, 2-5 x per day. When she takes lomotil, may have no BM in a day. Husband is managing her toileting and cleanup by himself, except 1 day per week they have a home aid to give him relief. She drinks 4 cans ensure per day, supplemented with puree fruit and vegetables. Otherwise, memory and gait are progresively worsening.  UPDATE 09/06/13: Patient had lumbar puncture by Dr. Alfredo Batty [facility]) this morning. Opening pressure was 8 cm water. 32 cc CSF was removed. Patient tolerated procedure well. Patient comes to office for further evaluation. She feels somewhat tired and dizzy. She has not eaten any breakfast morning. We gave patient some juice to drink and then had her tested with MMSE and 25 foot walk test.  UPDATE 08/23/13: Since last visit, memory is stable. Incontinence is worse. Gait is stable (still unsteady, short steps). Has been on carb/levo x 5 weeks. Perhaps some improvement in "demeanor" but not affecting gait or memory.  UPDATE 05/11/13: Since last visit, more progression of memory loss, stuttering, swallow difficulty. Gait is poor. More incontinence. Husband inquiring about NPH as a possibility.  UPDATE 09/22/12: Since last  visit, progression of swallow diff (in spite of esophageal dilation). Speech is stuttering, worse with anxiety. LBP stable. Not taking advil or tylenol. Rates pain as 7-8 out of 10.   UPDATE 03/10/12: Had epidural after last visit but not really beneficial. She intermittently does vestibular rehab exercises. Continues with intermittent vertigo. Mild cognitive impairment stable, MMSE 29/30 today. AFT 13. Has soft diet since continued problems with swallowing, esophagus was stretched in the past. Ambulating with a cane, no recent falls.   UPDATE 11/13/11: Last seen by Dr. Marjory Lies who ordered MRI of the brain and Lspine. MRI of the brain with mild periventricular and subcortical SVD and moderate bitemporal atrophy. MRI of the lumbar spine with disc bulging at L3-4 with potential impingement upon the exiting L3 nerve root. L4-L5 facet hypertrophy with no spinal stenosis,L5 laminectomy on the left. In comparison to MRI from 12/20/08 there is no significant interval change. Performing vestibular exercises daily and much less vertigo. Continues with swallowing problems, has seen GI,  on soft diet. Continues with lumbar back pain and wants epidural injection. Husband also concerned about mild cognitive impairment, has hx of bipolar disease.   UPDATE 07/08/11: Still c/o vertigo; also with right lower back pain.  Was eval'd for swallowing diff by GI, had espohageal dilation, and still has prob.  Also with dry mouth.  PRIOR HPI (07/24/10): 73 year old white female who  has history of peripheral neuropathy, numbness, and back pain.  She was on gabapentin but had drowsiness,  her psychiatrist, Dr. Chancy Hurter had taken her off of that.  She returns today for followup. She was last seen 01/23/2010. She says her neuropathy symptoms are about  the same.  She also complains with some dizziness, she has been seen by ENT in [facility], has been to vestibular rehab. She intermittently does her vestibular rehab exercises, says she was  more "faithful until several weeks ago then slacked off". Back pain is the same as previous visit, repear MRI of the lumbar spine after her last visit showed DDD,and prominent facet arthropathy, stable post op changes at left L5 without compression.  She has had no falls since April of 2010.   She does not use an assistive device.  She has significant anxiety and depression and treated by Dr. Everlena Cooper, psychiatrist.  She has had recent changes to her meds by Dr Jennelle Human.  She has a bipolar disorder as well. No new neurological complaints. See ROS.  REVIEW OF SYSTEMS: Full 14 system review of systems performed and notable only for decreased activity choking fecal incontinence bladder incontinence memory loss.   ALLERGIES: Allergies  Allergen Reactions  . Conray [Iothalamate Sodium] Shortness Of Breath    Ionic contrast.  Patient DOES tolerate Iohexol and other non-ionic contrast agents.    . Codeine   . Fosamax [Alendronate]   . Paxil [Paroxetine Hcl]   . Shellfish Allergy   . Zoloft [Sertraline Hcl]     HOME MEDICATIONS: Outpatient Prescriptions Prior to Visit  Medication Sig Dispense Refill  . aspirin 81 MG tablet Take 81 mg by mouth daily.      . clonazePAM (KLONOPIN) 0.5 MG tablet Take 0.5 mg by mouth 3 (three) times daily.       . QUEtiapine (SEROQUEL) 25 MG tablet Take 25 mg by mouth at bedtime.      . raloxifene (EVISTA) 60 MG tablet Take 60 mg by mouth daily.      . Valproic Acid (DEPAKENE) 250 MG/5ML SYRP syrup Take 6 mLs by mouth 3 (three) times daily. 2 doses      . carbidopa-levodopa (SINEMET IR) 25-100 MG per tablet Take 1 tablet by mouth 3 (three) times daily.  90 tablet  6  . Cyanocobalamin (VITAMIN B 12 PO) Take 500 mg by mouth.      . Iron-Vitamins (GERITOL) LIQD Take by mouth.       No facility-administered medications prior to visit.    PAST MEDICAL HISTORY: Past Medical History  Diagnosis Date  . Ear problem     inner  . Peripheral neuropathy   . Degenerative  disc disease   . Tremor     swallowing problems w/ esophageal stretching  . Hearing loss     w/aids    PAST SURGICAL HISTORY: Past Surgical History  Procedure Laterality Date  . Upper gi endoscopy  05/22/2011    FAMILY HISTORY: Family History  Problem Relation Age of Onset  . Alzheimer's disease Mother   . Seizures Mother   . Brain cancer Father     tumor  . Cancer Father     SOCIAL HISTORY:  History   Social History  . Marital Status: Married    Spouse Name: Dorinda Hill    Number of Children: 2  . Years of Education: 82yrs   Occupational History  . Retired    Social History Main Topics  . Smoking status: Never Smoker   . Smokeless tobacco: Never Used  . Alcohol Use: No     Comment: very little occasionally  . Drug Use: No  . Sexual Activity: Not on file   Other Topics Concern  . Not on file   Social History Narrative  Patient lives at [facility] assisted living since 02/17/14.   Caffeine Use: none; quit 2381yrs ago     PHYSICAL EXAM  Filed Vitals:   04/11/14 1347  BP: 110/67  Pulse: 83  Height: 5\' 6"  (1.676 m)  Weight: 109 lb 3.2 oz (49.533 kg)    Not recorded    Body mass index is 17.63 kg/(m^2).  GENERAL EXAM: Patient is in no distress.  CARDIOVASCULAR: Regular rate and rhythm, no murmurs, no carotid bruits  NEUROLOGIC: MENTAL STATUS: SOFT VOICE. HESITANT/STUTTERING SPEECH. Awake, alert, comprehension intact. POSITIVE SNOUT AND MYERSONS. MMSE 21/30. AFT 4. GDS 4. CRANIAL NERVE: pupils equal and reactive to light, visual fields full to confrontation, extraocular muscles intact, no nystagmus, facial sensation and strength symmetric, uvula midline, shoulder shrug symmetric, tongue midline. MODERATE-SEVERE DYSARTHRIA. MOTOR: NO TREMOR. MILD COGWHEELING. MOD BRADYKINESIA (LLE > RLE; >> BUE). Normal bulk and tone, full strength in the BUE, BLE SENSORY: normal and symmetric to light touch COORDINATION: BUE INTENTION TREMOR REFLEXES: BUE 1, KNEES  TRACE, ANKLES 0 GAIT/STATION: SLOW TO RISE. LEANS TO THE RIGHT AND BACKWARDS. SHORT UNSTEADY STEPS. NO ARM SWING. EN BLOC TURNING.   DIAGNOSTIC DATA (LABS, IMAGING, TESTING) - I reviewed patient records, labs, notes, testing and imaging myself where available.  No results found for this basename: WBC,  HGB,  HCT,  MCV,  PLT   No results found for this basename: na,  k,  cl,  co2,  glucose,  bun,  creatinine,  calcium,  prot,  albumin,  ast,  alt,  alkphos,  bilitot,  gfrnonaa,  gfraa   No results found for this basename: CHOL,  HDL,  LDLCALC,  LDLDIRECT,  TRIG,  CHOLHDL   No results found for this basename: HGBA1C   No results found for this basename: VITAMINB12   No results found for this basename: TSH    07/12/11 MRI brain - mild periventricular and subcortical small vessel ischemic disease and moderate bitemporal atrophy. Moderate ventriculomegaly.  07/12/11 MRI lumbar spine - disc bulging at L3-4 with potential impingement upon the exiting L3 nerve root. L4-L5 facet hypertrophy with no spinal stenosis, L5 laminectomy on the left. In comparison to MRI from 12/20/08 there is no significant interval change.   05/20/13 MRI brain (without) demonstrating: 1. Moderate perisylvian and mesial temporal atrophy. 2. Moderate ventriculomegaly. 3. Mild periventricular and subcortical chronic small vessel ischemic disease. 4. No change from MRI on 07/12/11.  12/20/13 MRI BRAIN:  1. Mild perisylvian and moderate bitemporal atrophy. Mild ventriculomegaly on ex vacuo basis.  2. Mild periventricular and subcortical chronic small vessel ischemic disease.  3. Compared to MRI on 05/19/13, there is no change.   12/20/13 MRI lumbar (without contrast):  1. L3-4: Disc bulging and facet hypertrophy with mild right and moderate left foraminal stenosis. Far left lateral disc bulging with potential impingement upon the exiting left L3 root. 2. L4-5: Facet hypertrophy with no spinal stenosis or foraminal  narrowing. Left L5 laminectomy. 3. In comparison to MRI from 07/12/11 and 12/20/08 there is no significant interval change.   ASSESSMENT AND PLAN  73 y.o. female with memory loss, balance difficulty, parkinsonism, positive snout, positive myersons sign, dysphagia, and moderate bitemporal brain atrophy. Suspect underlying neurodegenerative process, such as dementia with lewy bodies.  03/10/12 (baseline) MMSE 29/30, AFT 13  05/11/13 (baseline) Timed up and go (6910ft) - (38, 30, 33 secs) 25 ft walk - (29, 25 secs) MMSE - 20/30  08/23/13 (on carb/levo x 5 weeks) 25 ft walk - (  20, 21 secs) MMSE - 25/30  09/06/13 (s/p LP this AM, still on carb/levo) 25 ft walk - (31, 30 secs) MMSE - 21/30, AFT 10  12/07/13  MMSE 20/30, AFT 5  04/11/14  MMSE 21/30, AFT 4  PLAN: 1. Continue carb/levo 2. PT and ST at assisted living 3. Use rollator walker   Return in about 6 months (around 10/12/2014).    Suanne MarkerVIKRAM R. Misty Rago, MD 04/11/2014, 2:30 PM Certified in Neurology, Neurophysiology and Neuroimaging  [facility] [address], [address] [place], [place] [address] [phone]

## 2014-04-21 ENCOUNTER — Telehealth: Payer: Self-pay | Admitting: Diagnostic Neuroimaging

## 2014-04-21 NOTE — Telephone Encounter (Signed)
pls send in order. -VRP

## 2014-04-21 NOTE — Telephone Encounter (Signed)
Called Clydie BraunKaren with [facility] to inform her per Dr. Marjory LiesPenumalli to give a verbal order for twice a week for five weeks for speech therapy. Clydie BraunKaren verbalized understanding.

## 2014-04-21 NOTE — Telephone Encounter (Signed)
Kristina Luna with [facility] @ [phone], requesting order for twice a week for five weeks for speech therapy.  Will accept verbal order.  Please call and advise.

## 2014-04-21 NOTE — Telephone Encounter (Signed)
Please advise previous note. Thanks  °

## 2014-04-27 ENCOUNTER — Other Ambulatory Visit: Payer: Self-pay | Admitting: Diagnostic Neuroimaging

## 2014-06-06 ENCOUNTER — Telehealth: Payer: Self-pay | Admitting: Diagnostic Neuroimaging

## 2014-06-06 NOTE — Telephone Encounter (Signed)
pls go ahead with order. -VRP 

## 2014-06-06 NOTE — Telephone Encounter (Signed)
Clydie Braun with [facility] needing verbal orders for Speech Therapy 2x's this week.  Please return call and advise.

## 2014-06-27 NOTE — Telephone Encounter (Signed)
Left message that doctor gave the verbal order for ST 2 times a week.

## 2014-06-27 NOTE — Telephone Encounter (Signed)
Kristina Luna with [facility] @ [phone], still waiting on order for Speech Therapy 2x's a week.

## 2014-07-21 ENCOUNTER — Telehealth: Payer: Self-pay | Admitting: Diagnostic Neuroimaging

## 2014-07-21 NOTE — Telephone Encounter (Signed)
Agree. -VRP 

## 2014-07-21 NOTE — Telephone Encounter (Signed)
Kristina Luna with [facility] @ [phone], requesting 2 additional weeks of therapy.  Please call and advise

## 2014-07-21 NOTE — Telephone Encounter (Signed)
Called Ancil Boozerwen Colleran (physical therapist) and gave verbal order.

## 2014-07-22 ENCOUNTER — Other Ambulatory Visit: Payer: Self-pay

## 2014-08-04 ENCOUNTER — Encounter: Payer: Self-pay | Admitting: Diagnostic Neuroimaging

## 2014-08-06 ENCOUNTER — Emergency Department (HOSPITAL_COMMUNITY)
Admission: EM | Admit: 2014-08-06 | Discharge: 2014-08-08 | Disposition: A | Discharge status: Home / Self Care | Payer: Medicare Other | Attending: Emergency Medicine | Admitting: Emergency Medicine

## 2014-08-06 ENCOUNTER — Emergency Department (HOSPITAL_COMMUNITY): Payer: Medicare Other

## 2014-08-06 ENCOUNTER — Encounter (HOSPITAL_COMMUNITY): Payer: Self-pay | Admitting: Emergency Medicine

## 2014-08-06 DIAGNOSIS — Y9389 Activity, other specified: Secondary | ICD-10-CM | POA: Insufficient documentation

## 2014-08-06 DIAGNOSIS — S199XXA Unspecified injury of neck, initial encounter: Secondary | ICD-10-CM | POA: Insufficient documentation

## 2014-08-06 DIAGNOSIS — W19XXXA Unspecified fall, initial encounter: Secondary | ICD-10-CM

## 2014-08-06 DIAGNOSIS — Z79899 Other long term (current) drug therapy: Secondary | ICD-10-CM | POA: Insufficient documentation

## 2014-08-06 DIAGNOSIS — S0990XA Unspecified injury of head, initial encounter: Secondary | ICD-10-CM

## 2014-08-06 DIAGNOSIS — W01198A Fall on same level from slipping, tripping and stumbling with subsequent striking against other object, initial encounter: Secondary | ICD-10-CM | POA: Insufficient documentation

## 2014-08-06 DIAGNOSIS — S0512XA Contusion of eyeball and orbital tissues, left eye, initial encounter: Secondary | ICD-10-CM | POA: Diagnosis not present

## 2014-08-06 DIAGNOSIS — R29898 Other symptoms and signs involving the musculoskeletal system: Secondary | ICD-10-CM

## 2014-08-06 DIAGNOSIS — G20 Parkinson's disease: Secondary | ICD-10-CM | POA: Insufficient documentation

## 2014-08-06 DIAGNOSIS — W050XXA Fall from non-moving wheelchair, initial encounter: Secondary | ICD-10-CM | POA: Diagnosis not present

## 2014-08-06 DIAGNOSIS — M6281 Muscle weakness (generalized): Secondary | ICD-10-CM | POA: Insufficient documentation

## 2014-08-06 DIAGNOSIS — Z7982 Long term (current) use of aspirin: Secondary | ICD-10-CM | POA: Diagnosis not present

## 2014-08-06 DIAGNOSIS — S51002A Unspecified open wound of left elbow, initial encounter: Secondary | ICD-10-CM | POA: Diagnosis not present

## 2014-08-06 DIAGNOSIS — R0602 Shortness of breath: Secondary | ICD-10-CM

## 2014-08-06 DIAGNOSIS — Y92128 Other place in nursing home as the place of occurrence of the external cause: Secondary | ICD-10-CM | POA: Insufficient documentation

## 2014-08-06 DIAGNOSIS — R52 Pain, unspecified: Secondary | ICD-10-CM

## 2014-08-06 DIAGNOSIS — S3992XA Unspecified injury of lower back, initial encounter: Secondary | ICD-10-CM | POA: Diagnosis not present

## 2014-08-06 DIAGNOSIS — T1490XA Injury, unspecified, initial encounter: Secondary | ICD-10-CM

## 2014-08-06 DIAGNOSIS — S59902A Unspecified injury of left elbow, initial encounter: Secondary | ICD-10-CM | POA: Diagnosis present

## 2014-08-06 DIAGNOSIS — Z8739 Personal history of other diseases of the musculoskeletal system and connective tissue: Secondary | ICD-10-CM | POA: Diagnosis not present

## 2014-08-06 HISTORY — DX: Parkinson's disease: G20

## 2014-08-06 HISTORY — DX: Parkinson's disease without dyskinesia, without mention of fluctuations: G20.A1

## 2014-08-06 LAB — CBC
HCT: 39.1 % (ref 36.0–46.0)
HEMOGLOBIN: 12.5 g/dL (ref 12.0–15.0)
MCH: 32.4 pg (ref 26.0–34.0)
MCHC: 32 g/dL (ref 30.0–36.0)
MCV: 101.3 fL — ABNORMAL HIGH (ref 78.0–100.0)
Platelets: 97 10*3/uL — ABNORMAL LOW (ref 150–400)
RBC: 3.86 MIL/uL — AB (ref 3.87–5.11)
RDW: 13.6 % (ref 11.5–15.5)
WBC: 9.8 10*3/uL (ref 4.0–10.5)

## 2014-08-06 LAB — URINALYSIS, ROUTINE W REFLEX MICROSCOPIC
BILIRUBIN URINE: NEGATIVE
GLUCOSE, UA: NEGATIVE mg/dL
HGB URINE DIPSTICK: NEGATIVE
Ketones, ur: NEGATIVE mg/dL
Leukocytes, UA: NEGATIVE
Nitrite: NEGATIVE
PROTEIN: NEGATIVE mg/dL
Specific Gravity, Urine: 1.034 — ABNORMAL HIGH (ref 1.005–1.030)
UROBILINOGEN UA: 1 mg/dL (ref 0.0–1.0)
pH: 5.5 (ref 5.0–8.0)

## 2014-08-06 LAB — COMPREHENSIVE METABOLIC PANEL
ALK PHOS: 66 U/L (ref 39–117)
AST: 18 U/L (ref 0–37)
Albumin: 2.5 g/dL — ABNORMAL LOW (ref 3.5–5.2)
Anion gap: 14 (ref 5–15)
BUN: 37 mg/dL — ABNORMAL HIGH (ref 6–23)
CALCIUM: 9.1 mg/dL (ref 8.4–10.5)
CHLORIDE: 107 meq/L (ref 96–112)
CO2: 28 meq/L (ref 19–32)
Creatinine, Ser: 0.96 mg/dL (ref 0.50–1.10)
GFR calc Af Amer: 67 mL/min — ABNORMAL LOW (ref >90)
GFR, EST NON AFRICAN AMERICAN: 58 mL/min — AB (ref >90)
Glucose, Bld: 85 mg/dL (ref 70–99)
POTASSIUM: 4.3 meq/L (ref 3.7–5.3)
SODIUM: 149 meq/L — AB (ref 137–147)
Total Bilirubin: 0.4 mg/dL (ref 0.3–1.2)
Total Protein: 7.1 g/dL (ref 6.0–8.3)

## 2014-08-06 LAB — VALPROIC ACID LEVEL: Valproic Acid Lvl: 73.3 ug/mL (ref 50.0–100.0)

## 2014-08-06 MED ORDER — RALOXIFENE HCL 60 MG PO TABS
60.0000 mg | ORAL_TABLET | Freq: Every day | ORAL | Status: DC
  Administered 2014-08-07 – 2014-08-08 (×2): 60 mg via ORAL
  Filled 2014-08-06 (×2): qty 1

## 2014-08-06 MED ORDER — ASPIRIN 81 MG PO CHEW
81.0000 mg | CHEWABLE_TABLET | Freq: Every day | ORAL | Status: DC
  Administered 2014-08-07 – 2014-08-08 (×2): 81 mg via ORAL
  Filled 2014-08-06 (×2): qty 1

## 2014-08-06 MED ORDER — CARBIDOPA-LEVODOPA 25-100 MG PO TABS
1.0000 | ORAL_TABLET | Freq: Three times a day (TID) | ORAL | Status: DC
  Administered 2014-08-06 – 2014-08-08 (×7): 1 via ORAL
  Filled 2014-08-06 (×10): qty 1

## 2014-08-06 MED ORDER — QUETIAPINE FUMARATE 25 MG PO TABS
ORAL_TABLET | ORAL | Status: AC
Start: 2014-08-06 — End: 2014-08-07
  Filled 2014-08-06: qty 1

## 2014-08-06 MED ORDER — VALPROIC ACID 250 MG/5ML PO SYRP
300.0000 mg | ORAL_SOLUTION | Freq: Three times a day (TID) | ORAL | Status: DC
  Administered 2014-08-06 – 2014-08-08 (×7): 300 mg via ORAL
  Filled 2014-08-06 (×10): qty 7.5

## 2014-08-06 MED ORDER — QUETIAPINE FUMARATE 25 MG PO TABS
25.0000 mg | ORAL_TABLET | Freq: Every day | ORAL | Status: DC
  Administered 2014-08-06 – 2014-08-07 (×2): 25 mg via ORAL
  Filled 2014-08-06: qty 1

## 2014-08-06 MED ORDER — ENSURE COMPLETE PO LIQD
237.0000 mL | Freq: Four times a day (QID) | ORAL | Status: DC
  Administered 2014-08-06 – 2014-08-08 (×7): 237 mL via ORAL
  Filled 2014-08-06 (×12): qty 237

## 2014-08-06 MED ORDER — LOPERAMIDE HCL 2 MG PO CAPS
2.0000 mg | ORAL_CAPSULE | Freq: Three times a day (TID) | ORAL | Status: DC
  Administered 2014-08-06 – 2014-08-08 (×7): 2 mg via ORAL
  Filled 2014-08-06 (×7): qty 1

## 2014-08-06 MED ORDER — CLONAZEPAM 0.5 MG PO TABS
0.5000 mg | ORAL_TABLET | Freq: Three times a day (TID) | ORAL | Status: DC
  Administered 2014-08-06 – 2014-08-08 (×7): 0.5 mg via ORAL
  Filled 2014-08-06 (×7): qty 1

## 2014-08-06 MED ORDER — ENSURE COMPLETE PO LIQD
237.0000 mL | Freq: Once | ORAL | Status: AC
  Administered 2014-08-06: 237 mL via ORAL
  Filled 2014-08-06: qty 237

## 2014-08-06 NOTE — Progress Notes (Signed)
CSW received consult from MD for skilled placement. CSW met with family. Pt has parkinsons and is no longer able to live in assisted living because she cannot use her legs. Husband states that they are able to pay privately for skilled care. CSW collected information to complete FL2. CSW in process of faxing pt out to skilled facilities.  Rochele Pages,     ED CSW  phone: [phone]

## 2014-08-06 NOTE — ED Notes (Addendum)
Per EMS pt comes from [facility] where pt was found laying face down on the floor, from possible fall.  Pt has parkinson's so she does have some shaking but when EMS got pt off floor pt she was shaking more than her baseline.  Pt didn't complain of any back or neck pain so was cleared for C spine.  Pt has skin tear on left elbow and left hand.  Pt's vitals  Per EMS 90/70, HR 72, R 16.   Pt c/o to me of back pain.

## 2014-08-06 NOTE — ED Notes (Signed)
Bed: [id number] Expected date: 08/06/14 Expected time: 10:32 AM Means of arrival: Ambulance Comments: Fall, skin tear

## 2014-08-06 NOTE — ED Provider Notes (Signed)
CSN: [id number]     Arrival date & time 08/06/14  1035 History   First MD Initiated Contact with Patient 08/06/14 1036     Chief Complaint  Patient presents with  . Fall     L5 caveat:hearing loss  HPI Husband reports history of Parkinson's disease and reports worsening gait and strength over the past 3-6 months.  Today the patient was too weak to use her walker and was placed in a wheelchair and then fell out of the wheelchair.  This occurred at her assisted living facility.  The husband is concerned because her ability to ambulate and support herself has drastically declined over the past several weeks.  She has not had fever or chills.  No reported diarrhea.  No reports of vomiting.  She drinks insuring it sounds as though her ensure intake is decreased over the past several days.  No rash noted by the husband.  It was a small skin tear that occurred from the fall today when she hit her left arm.  Husband reports a small bruise over her left cheek as well   Past Medical History  Diagnosis Date  . Ear problem     inner  . Peripheral neuropathy   . Degenerative disc disease   . Tremor     swallowing problems w/ esophageal stretching  . Hearing loss     w/aids  . Parkinson disease    Past Surgical History  Procedure Laterality Date  . Upper gi endoscopy  05/22/2011   Family History  Problem Relation Age of Onset  . Alzheimer's disease Mother   . Seizures Mother   . Brain cancer Father     tumor  . Cancer Father    History  Substance Use Topics  . Smoking status: Never Smoker   . Smokeless tobacco: Never Used  . Alcohol Use: No     Comment: very little occasionally   OB History   Grav Para Term Preterm Abortions TAB SAB Ect Mult Living                 Review of Systems  Unable to perform ROS     Allergies  Conray; Codeine; Fosamax; Paxil; Shellfish allergy; and Zoloft  Home Medications   Prior to Admission medications   Medication Sig Start Date End Date  Taking? Authorizing Provider  aspirin 81 MG tablet Take 81 mg by mouth daily.   Yes Historical Provider, MD  carbidopa-levodopa (SINEMET IR) 25-100 MG per tablet Take 1 tablet by mouth 3 (three) times daily.   Yes Historical Provider, MD  clonazePAM (KLONOPIN) 0.5 MG tablet Take 0.5 mg by mouth 3 (three) times daily.    Yes Historical Provider, MD  ENSURE (ENSURE) Take 1 Can by mouth 4 (four) times daily. Three times daily at each meal (8 am, 12 pm, & 5 pm) then one at 2 pm as a snack.   Yes Historical Provider, MD  loperamide (IMODIUM) 2 MG capsule Take 1 capsule by mouth 4 (four) times daily -  before meals and at bedtime. 04/07/14  Yes Historical Provider, MD  Multiple Vitamin (DAILY VITES PO) Take 1 tablet by mouth daily with breakfast.   Yes Historical Provider, MD  QUEtiapine (SEROQUEL) 25 MG tablet Take 25 mg by mouth at bedtime.   Yes Historical Provider, MD  raloxifene (EVISTA) 60 MG tablet Take 60 mg by mouth daily at 12 noon.    Yes Historical Provider, MD  Valproic Acid (DEPAKENE) 250 MG/5ML SYRP  syrup Take 6 mLs by mouth 3 (three) times daily.  03/14/13  Yes Historical Provider, MD   BP 115/60  Pulse 68  Temp(Src) 98.4 F (36.9 C) (Oral)  Resp 16  SpO2 97% Physical Exam  Nursing note and vitals reviewed. Constitutional: She appears well-developed and well-nourished. No distress.  HENT:  Head: Normocephalic.  Small bruise to the left infraorbital rim.  Extraocular movements are intact.  Eyes: EOM are normal.  Neck: Neck supple.  Mild cervical and paracervical tenderness  Cardiovascular: Normal rate, regular rhythm and normal heart sounds.   Pulmonary/Chest: Effort normal and breath sounds normal.  Abdominal: Soft. She exhibits no distension. There is no tenderness.  Musculoskeletal: Normal range of motion.  Full range of motion bilateral hips knees and ankles.  Full range of motion bilateral wrists elbows and shoulders.small skin tear to the left elbow with full range of motion  of the left elbow. Mild parathoracic and paralumbar tenderness without step-off  Neurological: She is alert.  Oriented 2  Skin: Skin is warm and dry. No rash noted. No erythema.  Psychiatric: She has a normal mood and affect. Judgment normal.    ED Course  Procedures (including critical care time) Labs Review Labs Reviewed  CBC - Abnormal; Notable for the following:    RBC 3.86 (*)    MCV 101.3 (*)    Platelets 97 (*)    All other components within normal limits  COMPREHENSIVE METABOLIC PANEL - Abnormal; Notable for the following:    Sodium 149 (*)    BUN 37 (*)    Albumin 2.5 (*)    GFR calc non Af Amer 58 (*)    GFR calc Af Amer 67 (*)    All other components within normal limits  URINALYSIS, ROUTINE W REFLEX MICROSCOPIC - Abnormal; Notable for the following:    Color, Urine AMBER (*)    Specific Gravity, Urine 1.034 (*)    All other components within normal limits    Imaging Review Dg Thoracic Spine 2 View  08/06/2014   CLINICAL DATA:  Found on the more. Possible fall. Parkinson's disease.  EXAM: THORACIC SPINE - 2 VIEW  COMPARISON:  None.  FINDINGS: There is mild levoscoliosis. No acute loss vertebral body height or disc height. There is multiple levels of endplate osteophytosis. Normal is paraspinal lines.  IMPRESSION: No evidence of acute injury to the lumbar spine by radiograph. Chronic degenerative changes noted.   Electronically Signed   By: Genevive BiStewart  Edmunds M.D.   On: 08/06/2014 12:04   Dg Lumbar Spine Complete  08/06/2014   CLINICAL DATA:  Fall and Parkinson's disease.  EXAM: LUMBAR SPINE - COMPLETE 4+ VIEW  COMPARISON:  MRI of the lumbar spine on 12/20/2013.  FINDINGS: Mild diffuse spondylosis of the lumbar spine noted at all levels with disc space narrowing, proliferative changes and associated mild rightward convex scoliosis present. No evidence of acute fracture or subluxation. Facet hypertrophy also present, primarily at L4-5 and L5-S1. No focal bony lesions.   Incidental large amount of fecal material seen throughout the visualized colon.  IMPRESSION: Mild diffuse spondylosis of the lumbar spine. No acute fracture is identified.   Electronically Signed   By: Irish LackGlenn  Yamagata M.D.   On: 08/06/2014 12:05   Ct Head Wo Contrast  08/06/2014   CLINICAL DATA:  Fall  EXAM: CT HEAD WITHOUT CONTRAST  CT CERVICAL SPINE WITHOUT CONTRAST  TECHNIQUE: Multidetector CT imaging of the head and cervical spine was performed following the standard protocol  without intravenous contrast. Multiplanar CT image reconstructions of the cervical spine were also generated.  COMPARISON:  12/20/2013  FINDINGS: CT HEAD FINDINGS  Severe hydrocephalus has slowly worsened comparing prior MRI is dated 282015 and 2012.  No acute hemorrhage. No midline shift. Chronic ischemic changes. Global atrophy. Mastoid air cells are clear. No skull fracture.  CT CERVICAL SPINE FINDINGS  No fracture. No dislocation. Anatomic alignment. Severe multilevel disc space narrowing.  IMPRESSION: Severe intracranial hydrocephalus as slowly worsened dating back to 2012. Normal pressure hydrocephalus is not excluded. No acute injury.  No evidence of acute cervical spine injury.   Electronically Signed   By: Maryclare BeanArt  Hoss M.D.   On: 08/06/2014 12:19   Ct Cervical Spine Wo Contrast  08/06/2014   CLINICAL DATA:  Fall  EXAM: CT HEAD WITHOUT CONTRAST  CT CERVICAL SPINE WITHOUT CONTRAST  TECHNIQUE: Multidetector CT imaging of the head and cervical spine was performed following the standard protocol without intravenous contrast. Multiplanar CT image reconstructions of the cervical spine were also generated.  COMPARISON:  12/20/2013  FINDINGS: CT HEAD FINDINGS  Severe hydrocephalus has slowly worsened comparing prior MRI is dated 402015 and 2012.  No acute hemorrhage. No midline shift. Chronic ischemic changes. Global atrophy. Mastoid air cells are clear. No skull fracture.  CT CERVICAL SPINE FINDINGS  No fracture. No dislocation. Anatomic  alignment. Severe multilevel disc space narrowing.  IMPRESSION: Severe intracranial hydrocephalus as slowly worsened dating back to 2012. Normal pressure hydrocephalus is not excluded. No acute injury.  No evidence of acute cervical spine injury.   Electronically Signed   By: Maryclare BeanArt  Hoss M.D.   On: 08/06/2014 12:19  I personally reviewed the imaging tests through PACS system I reviewed available ER/hospitalization records through the EMR    EKG Interpretation None      MDM   Final diagnoses:  Fall  Head injury  Injury  Lower extremity weakness  Pain   Labs, urine, imaging without acute abnormality.  The patient was unable to ambulate in the emergency department.  She is unsafe to go back to the assisted living facility.  I've discussed this with my social work team who will work on placement.  We will obtain physical therapy and occupational therapy consults here in the emergency department and attempt placement.  The husband is agreeable to this.  The patient be placed back on her home medications at this time.  Family understands that this could be a prolonged process here in the emergency department and this is unable to likely result in placement in an expedient fashion.  She will likely be in the ER for several days.  Family is aware.    Lyanne CoKevin M Amaziah Raisanen, MD 08/06/14 [phone]

## 2014-08-07 ENCOUNTER — Emergency Department (HOSPITAL_COMMUNITY): Payer: Medicare Other

## 2014-08-07 DIAGNOSIS — S51002A Unspecified open wound of left elbow, initial encounter: Secondary | ICD-10-CM | POA: Diagnosis not present

## 2014-08-07 LAB — BASIC METABOLIC PANEL
ANION GAP: 12 (ref 5–15)
BUN: 34 mg/dL — AB (ref 6–23)
CALCIUM: 9.1 mg/dL (ref 8.4–10.5)
CO2: 30 mEq/L (ref 19–32)
Chloride: 105 mEq/L (ref 96–112)
Creatinine, Ser: 0.76 mg/dL (ref 0.50–1.10)
GFR calc Af Amer: >90 mL/min (ref >90)
GFR calc non Af Amer: 82 mL/min — ABNORMAL LOW (ref >90)
Glucose, Bld: 114 mg/dL — ABNORMAL HIGH (ref 70–99)
Potassium: 4.1 mEq/L (ref 3.7–5.3)
Sodium: 147 mEq/L (ref 137–147)

## 2014-08-07 LAB — TROPONIN I: Troponin I: <0.3 ng/mL (ref <0.30)

## 2014-08-07 LAB — CBC WITH DIFFERENTIAL/PLATELET
BASOS ABS: 0 10*3/uL (ref 0.0–0.1)
BASOS PCT: 0 % (ref 0–1)
EOS PCT: 0 % (ref 0–5)
Eosinophils Absolute: 0 10*3/uL (ref 0.0–0.7)
HEMATOCRIT: 40 % (ref 36.0–46.0)
HEMOGLOBIN: 12.6 g/dL (ref 12.0–15.0)
Lymphocytes Relative: 18 % (ref 12–46)
Lymphs Abs: 1.4 10*3/uL (ref 0.7–4.0)
MCH: 31.9 pg (ref 26.0–34.0)
MCHC: 31.5 g/dL (ref 30.0–36.0)
MCV: 101.3 fL — ABNORMAL HIGH (ref 78.0–100.0)
MONO ABS: 0.9 10*3/uL (ref 0.1–1.0)
MONOS PCT: 11 % (ref 3–12)
Neutro Abs: 5.6 10*3/uL (ref 1.7–7.7)
Neutrophils Relative %: 71 % (ref 43–77)
Platelets: 130 10*3/uL — ABNORMAL LOW (ref 150–400)
RBC: 3.95 MIL/uL (ref 3.87–5.11)
RDW: 13.5 % (ref 11.5–15.5)
WBC: 7.9 10*3/uL (ref 4.0–10.5)

## 2014-08-07 MED ORDER — IBUPROFEN 200 MG PO TABS
400.0000 mg | ORAL_TABLET | Freq: Four times a day (QID) | ORAL | Status: DC | PRN
  Administered 2014-08-07: 400 mg via ORAL
  Filled 2014-08-07: qty 2

## 2014-08-07 MED ORDER — ALBUTEROL SULFATE (2.5 MG/3ML) 0.083% IN NEBU
2.5000 mg | INHALATION_SOLUTION | RESPIRATORY_TRACT | Status: DC | PRN
  Administered 2014-08-07: 2.5 mg via RESPIRATORY_TRACT
  Filled 2014-08-07: qty 3

## 2014-08-07 NOTE — ED Provider Notes (Addendum)
Asked to see pt by nursing.  Pt took meds with ensure.  C/o Coberly after. No apparent aspiration. No regurgitation of meds or liquids. Saturations 90%. On my exam she has clear lungs. No increased worker breathing. Appears calm. EKG shows no acute changes. Chest x-ray shows no obvious aspiration. Without intervention within 5-10 minutes her saturations are 94-95% with clear lungs and not tachycardic. Repeat labs pending. Nebulized albuterol pending.  Rolland PorterMark Daje Stark, MD 08/07/14 2258  00:47:  Repeat labs show normal troponin. Hemoglobin without change. Patient sleeping. Saturations 96%. Clear lungs.  Rolland PorterMark Dick Hark, MD 08/08/14 [phone]

## 2014-08-07 NOTE — Progress Notes (Signed)
CARE MANAGEMENT NOTE 08/07/2014  Patient:  Kristina Luna,Kristina Luna   Account Number:  [id number]  Date Initiated:  08/06/2014  Documentation initiated by:  [facility]AV[facility],Andy Allende  Subjective/Objective Assessment:   Parkinson's     Action/Plan:   from ALF   Anticipated DC Date:  08/07/2014   Anticipated DC Plan:  SKILLED NURSING FACILITY  In-house referral  Clinical Social Worker      DC Planning Services  CM consult      Choice offered to / List presented to:             Status of service:  Completed, signed off Medicare Important Message given?   (If response is "NO", the following Medicare IM given date fields will be blank) Date Medicare IM given:   Medicare IM given by:   Date Additional Medicare IM given:   Additional Medicare IM given by:    Discharge Disposition:  SKILLED NURSING FACILITY  Per UR Regulation:    If discussed at Long Length of Stay Meetings, dates discussed:    Comments:  08/06/2014 1500 Referral call from ED. CSW referral for placement. Isidoro DonningAlesia Taygan Connell RN CCM Case Mgmt phone [phone]

## 2014-08-07 NOTE — ED Notes (Signed)
Pt resting comfortable. Offered to turn and reposition patient however, family would like us to wait and give meds and reposition in 45 minutes to minimize disturbance. Will check back, no further needs at this time.

## 2014-08-07 NOTE — ED Notes (Signed)
Pharmacy was called at 0900 to send Ensure.

## 2014-08-07 NOTE — ED Notes (Signed)
Patient coughing, but able to speak and communicate with staff. Pt took HS medications crushed and mixed with Ensure and with a straw as per family. Pt tolerated well and remains sitting up in bed. Pt with mild cough, O2 sats at 92%. Dr. Fayrene FearingJames notified to assess patient. Awaiting orders.

## 2014-08-07 NOTE — ED Notes (Addendum)
Pt was repositioned and placed on right side and brief changed. Pt had episode of medium amount of soft stool.

## 2014-08-07 NOTE — ED Notes (Signed)
Social Worker at bedside.

## 2014-08-07 NOTE — ED Notes (Signed)
Pt sleeping. 

## 2014-08-07 NOTE — ED Notes (Signed)
Dr. Fayrene FearingJames in to reassess patient and review EKG and chest x-ray. Pt's O2 sats now 96% RA. No s/s of distress noted. Pt resting with eyes closed.

## 2014-08-07 NOTE — ED Notes (Signed)
Portable chest x-ray completed at this time by radiology. Pt tolerated well.

## 2014-08-07 NOTE — Progress Notes (Addendum)
2:21pm. CSW has faxed pt out to all SNF facilities in [facility] area. Currently awaiting response. CSW submitted pt for a passarr number, but system is down so waiting on number.  CSW met with husband today and reviewed status of placement process.   Rochele Pages,     ED CSW  phone: [phone]

## 2014-08-07 NOTE — ED Notes (Signed)
Called pharmacy about the ability to crush Evista, they report it is okay to crush it.

## 2014-08-07 NOTE — Evaluation (Signed)
Physical Therapy Evaluation Patient Details Name: Orlene OchHelen Millon MRN: [id number] DOB: 15-Dec-1940 Today's Date: 08/07/2014   History of Present Illness  73 yo female with history of Parkinson's disease.Husband reports  worsening gait and strength over the past 3-6 months.  Today the patient was too weak to use her walker and was placed in a wheelchair and then fell out of the wheelchair.  This occurred at her assisted living facility.  The husband is concerned because her ability to ambulate and support herself has drastically declined over the past several week  Clinical Impression  Patient presents with decreased functional mobility, is very rigid and tense in trunk and legs  During mobility. Spouse present to offer  History, that patient was ambulatory with RW until recently, attended church weekly via WC. Patient will benefit from PT to address problems listed in chart below.    Follow Up Recommendations SNF;Supervision/Assistance - 24 hour    Equipment Recommendations  None recommended by PT    Recommendations for Other Services       Precautions / Restrictions Precautions Precautions: Fall Precaution Comments: per spouse, drinks only ensure, not solid food.      Mobility  Bed Mobility Overal bed mobility: Needs Assistance;+2 for physical assistance;+ 2 for safety/equipment Bed Mobility: Rolling;Supine to Sit;Sit to Supine Rolling: Max assist;+2 for safety/equipment;+2 for physical assistance   Supine to sit: Max assist;+2 for safety/equipment;+2 for physical assistance Sit to supine: +2 for physical assistance;Max assist   General bed mobility comments: hand over hand to reach for rail to assist with rolling to side, pt tenses trunk and legs, rolls  with difficulty. total assist for trunk into upright sitting and back to supine, support of legs off/onto bed., trunk rigid  Transfers Overall transfer level: Needs assistance   Transfers: Sit to/from Stand Sit to Stand: Total  assist;+2 physical assistance;+2 safety/equipment;From elevated surface         General transfer comment: attempted to stand on the step stool due to how high the stretcher is from floor. patient made an attempt  but not able to clear buttocks, patient with grimacing and moaning, ? frightened or in pain as patient is unable to  verbalize.  Ambulation/Gait                Stairs            Wheelchair Mobility    Modified Rankin (Stroke Patients Only)       Balance Overall balance assessment: History of Falls;Needs assistance Sitting-balance support: Bilateral upper extremity supported;Feet supported   Sitting balance - Comments: patien able to sit erect on the edge, tendency to posterior lean but not falling backwards., did not attempt to lean forward. Postural control: Posterior lean                                   Pertinent Vitals/Pain Pain Assessment: Faces Faces Pain Scale: Hurts little more Pain Location: when knees flexed and rolling onto sides Pain Descriptors / Indicators: Grimacing  Difficulty in determining pain location and severity due to patient's inability to communicate verbally vs just being  Frightened  About the  The mobility.    Home Living Family/patient expects to be discharged to:: Skilled nursing facility                 Additional Comments: per spouse was ambulatory with RW with supervision until past several days,  Prior Function Level of Independence: Needs assistance   Gait / Transfers Assistance Needed: with RW           Hand Dominance        Extremity/Trunk Assessment   Upper Extremity Assessment: RUE deficits/detail;LUE deficits/detail RUE Deficits / Details: moves  arms bu t tends to keep rigid, held Ensure to drink while sitting on the  EOB     LUE Deficits / Details: similar to R   Lower Extremity Assessment: RLE deficits/detail;LLE deficits/detail RLE Deficits / Details: decreased knee  extension in sitting. does flex hip/knee with tactile cues, tends to keep legs extended when  supine LLE Deficits / Details: very similar to R  Cervical / Trunk Assessment: Normal  Communication      Cognition Arousal/Alertness: Awake/alert Behavior During Therapy: WFL for tasks assessed/performed Overall Cognitive Status: Impaired/Different from baseline Area of Impairment: Following commands               General Comments: spouse reports that patient is usually more vocal, follows and more " socialable:    General Comments      Exercises        Assessment/Plan    PT Assessment Patient needs continued PT services  PT Diagnosis Difficulty walking;Generalized weakness;Acute pain   PT Problem List    PT Treatment Interventions DME instruction;Gait training;Functional mobility training;Therapeutic activities;Therapeutic exercise;Patient/family education   PT Goals (Current goals can be found in the Care Plan section) Acute Rehab PT Goals Patient Stated Goal: per spouse- to walk again PT Goal Formulation: With family Time For Goal Achievement: 08/21/14 Potential to Achieve Goals: Fair    Frequency Min 3X/week   Barriers to discharge        Co-evaluation               End of Session   Activity Tolerance: Patient tolerated treatment well Patient left: with call bell/phone within reach;with family/visitor present Nurse Communication: Mobility status    Functional Assessment Tool Used: clinical judgement Functional Limitation: Mobility: Walking and moving around Mobility: Walking and Moving Around Current Status (O9629(G8978): 100 percent impaired, limited or restricted Mobility: Walking and Moving Around Goal Status (B2841(G8979): At least 40 percent but less than 60 percent impaired, limited or restricted    Time: 0813-0855 PT Time Calculation (min): 42 min   Charges:   PT Evaluation $Initial PT Evaluation Tier I: 1 Procedure PT Treatments $Therapeutic  Activity: 38-52 mins   PT G Codes:   Functional Assessment Tool Used: clinical judgement Functional Limitation: Mobility: Walking and moving around    [place] Lake[place], Josedaniel Haye Elizabeth 08/07/2014, 9:19 AM Blanchard KelchKaren Burnell Hurta PT [phone]

## 2014-08-07 NOTE — ED Notes (Addendum)
Pt repositioned on left side and knees propped and brief changed. Incontinent cleanser  and protective ointment used.

## 2014-08-07 NOTE — ED Notes (Signed)
Patient with O2 sats at 93-94%. MD aware. Dr. Fayrene FearingJames in to assess patient 1:1. EKG performed as ordered. Pt resting at this time; no s/s of distress noted.

## 2014-08-07 NOTE — ED Notes (Signed)
Social worker speaking with family

## 2014-08-08 ENCOUNTER — Encounter: Payer: Self-pay | Admitting: Diagnostic Neuroimaging

## 2014-08-08 DIAGNOSIS — S51002A Unspecified open wound of left elbow, initial encounter: Secondary | ICD-10-CM | POA: Diagnosis not present

## 2014-08-08 LAB — I-STAT CHEM 8, ED
BUN: 32 mg/dL — ABNORMAL HIGH (ref 6–23)
Calcium, Ion: 1.15 mmol/L (ref 1.13–1.30)
Chloride: 108 mEq/L (ref 96–112)
Creatinine, Ser: 0.8 mg/dL (ref 0.50–1.10)
GLUCOSE: 121 mg/dL — AB (ref 70–99)
HEMATOCRIT: 38 % (ref 36.0–46.0)
HEMOGLOBIN: 12.9 g/dL (ref 12.0–15.0)
POTASSIUM: 3.8 meq/L (ref 3.7–5.3)
SODIUM: 147 meq/L (ref 137–147)
TCO2: 30 mmol/L (ref 0–100)

## 2014-08-08 MED ORDER — CARBIDOPA-LEVODOPA 25-100 MG PO TABS: 1.5000 | ORAL_TABLET | Freq: Three times a day (TID) | ORAL | Status: AC

## 2014-08-08 NOTE — ED Notes (Signed)
Spoke with Dr. Maricela BoAllen--is wawiting on i-stat chem to be drawn and resulted before he puts in order to d/c pt, [facility] here for transport--writer explained to [facility] that lab work needed before pt can be discharged/transported to NH, notified phlebotomy to come draw labs.

## 2014-08-08 NOTE — Telephone Encounter (Signed)
Rx updated and sent per Dr Richrd HumblesPenumalli's instruction.

## 2014-08-08 NOTE — Progress Notes (Signed)
CSW provided bed offers to pt family. Pt family [facility]. Pt family to go meet admissions at 1:15. CSW to assist with transfer once patient family has completed paperwork.   Byrd HesselbachKristen Chane Magner, LCSW [phone]  ED CSW 08/08/2014 1031am

## 2014-08-08 NOTE — ED Notes (Signed)
Patient with O2 sats at 96% room air; no s/s of distress noted at this time. Respirations regular and unlabored, skin warm and dry.

## 2014-08-08 NOTE — ED Notes (Signed)
Notified [employer] non-Emergency transport taht pt needs to be transported to [facility].

## 2014-08-16 ENCOUNTER — Telehealth: Payer: Self-pay

## 2014-08-16 ENCOUNTER — Encounter: Payer: Self-pay | Admitting: Diagnostic Neuroimaging

## 2014-08-16 NOTE — Telephone Encounter (Signed)
Called spouse in response to MyChart message. Gave instructions per Dr. Fonda KinderPenumallis reply. Spouse says [facility] is [facility] and they may just wait until upcoming appt in Jan.

## 2014-09-28 ENCOUNTER — Telehealth: Payer: Self-pay | Admitting: Diagnostic Neuroimaging

## 2014-09-28 NOTE — Telephone Encounter (Signed)
FYI, Spouse called to report patient passed away and wanted to thank Dr. Marjory LiesPenumalli for his services.

## 2014-09-28 NOTE — Telephone Encounter (Signed)
I called husband. Patient has died peacefully in her sleep. I expressed my condolences.  -VRP

## 2014-10-07 DEATH — deceased

## 2014-10-13 ENCOUNTER — Ambulatory Visit: Payer: Medicare Other | Admitting: Diagnostic Neuroimaging

## 2016-10-29 IMAGING — CT CT HEAD W/O CM
2 of 4 series · 14 of 30 positions shown, 17 images · non-contrast
Comparison: 12/20/2013

CLINICAL DATA: Fall

EXAM:
CT HEAD WITHOUT CONTRAST
CT CERVICAL SPINE WITHOUT CONTRAST
TECHNIQUE: Multidetector CT imaging of the head and cervical spine was
performed following the standard protocol without intravenous
contrast. Multiplanar CT image reconstructions of the cervical spine
were also generated.

[Series 3: bone windows · axial · 0.43mm/px · z∈[-121,-25]mm · 5 of 50 slices shown]
[im 9/50  bone]
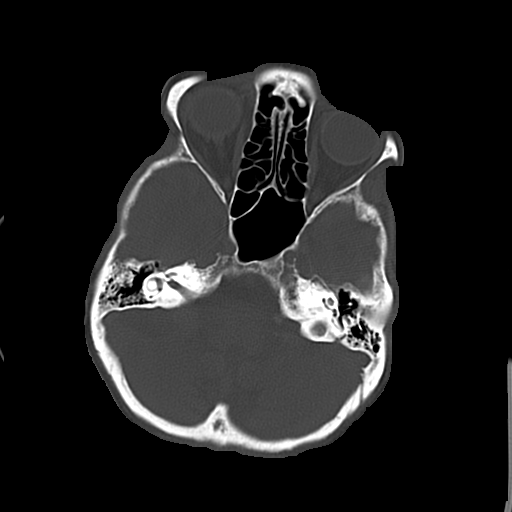
[im 17/50  bone]
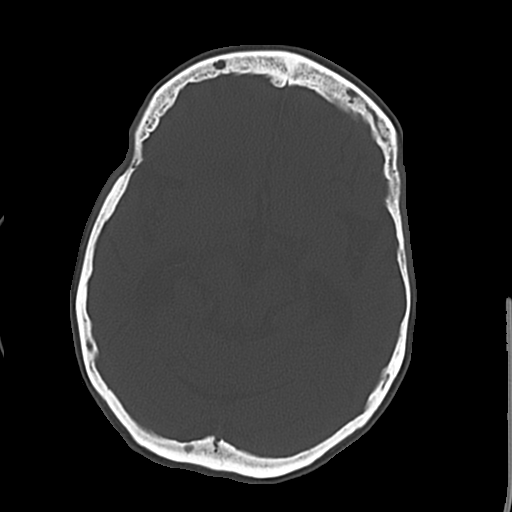
[im 25/50  bone]
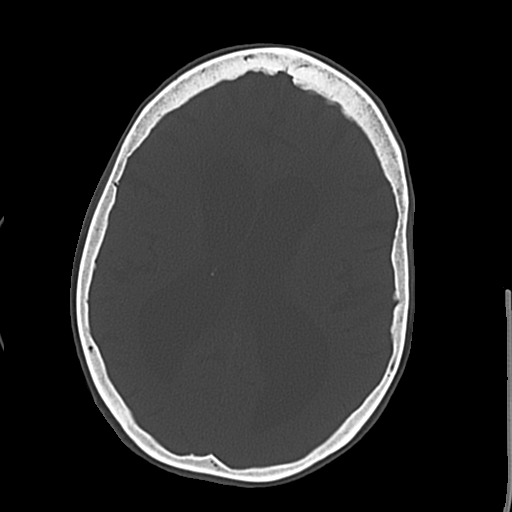
[im 33/50  bone]
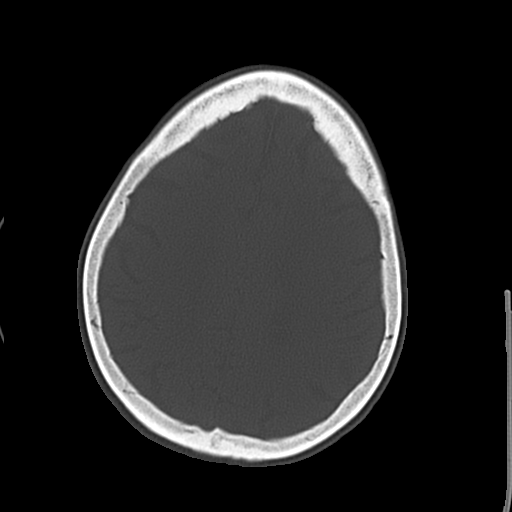
[im 41/50  bone]
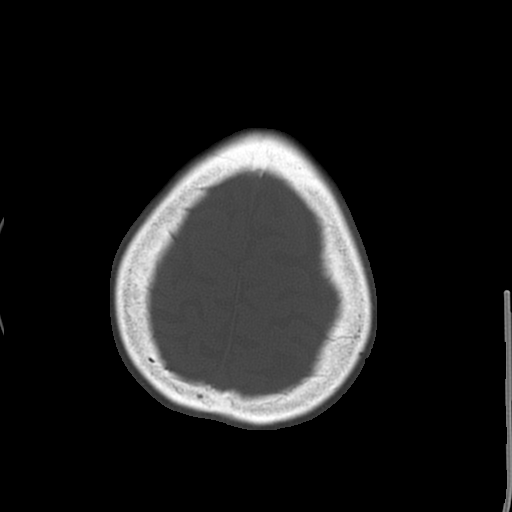

[Series 5: axial recon · axial · 0.23mm/px · z∈[-311,-156]mm · 9 of 101 slices shown, 12 images]
[im 11/101  brain]
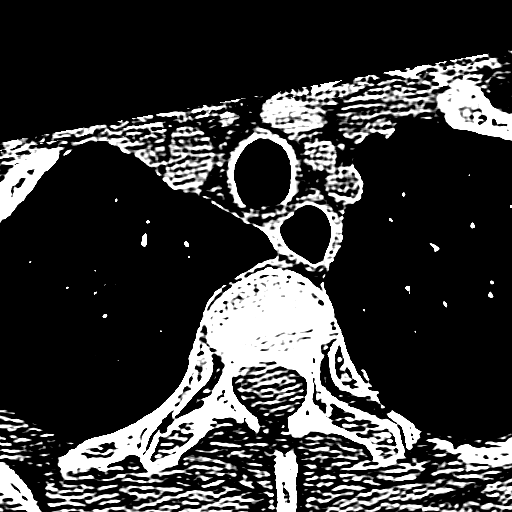
[im 11/101  bone]
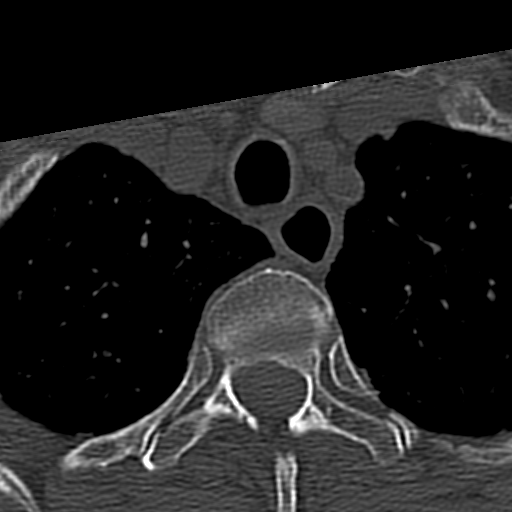
[im 21/101  brain]
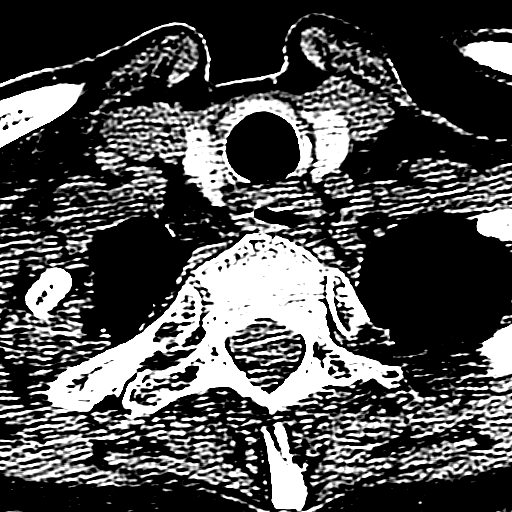
[im 31/101  brain]
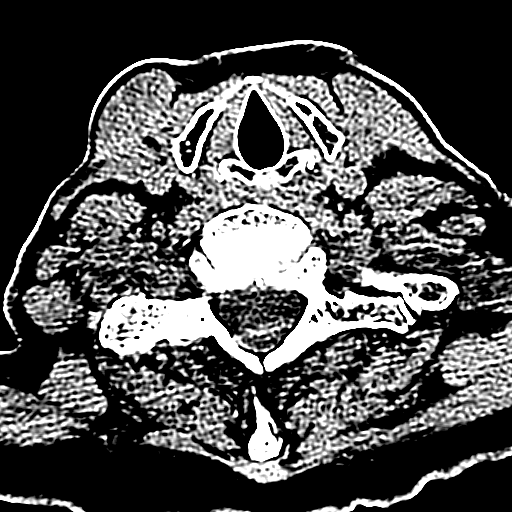
[im 41/101  brain]
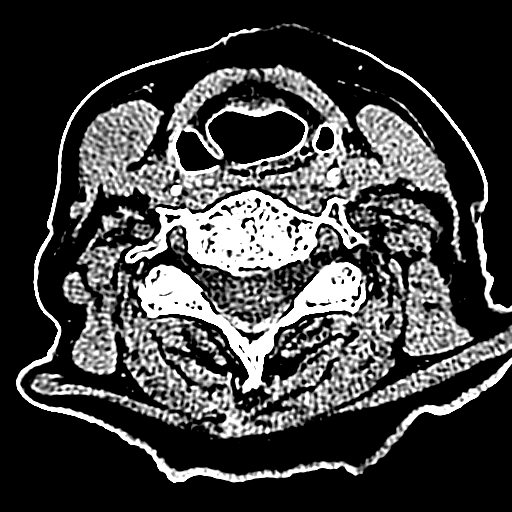
[im 51/101  brain]
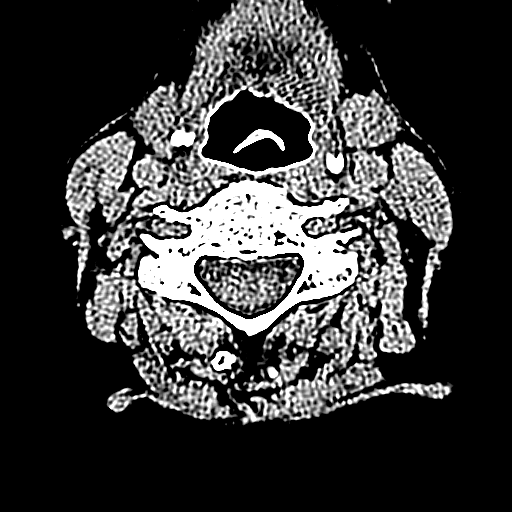
[im 51/101  bone]
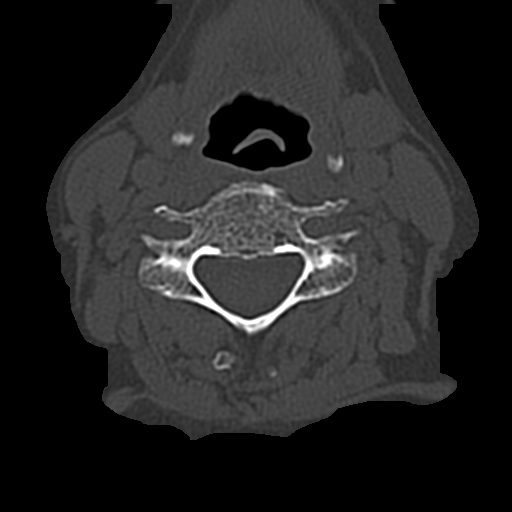
[im 61/101  brain]
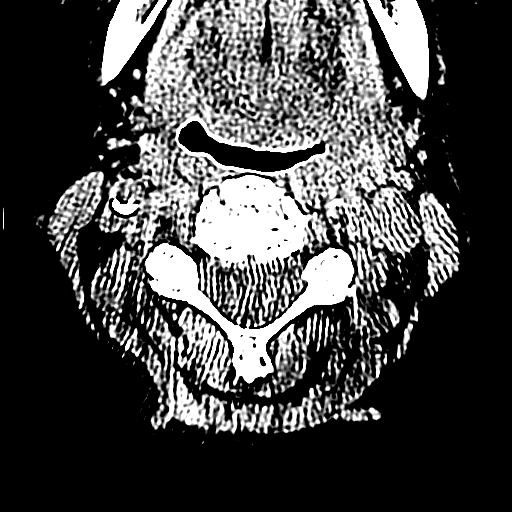
[im 71/101  brain]
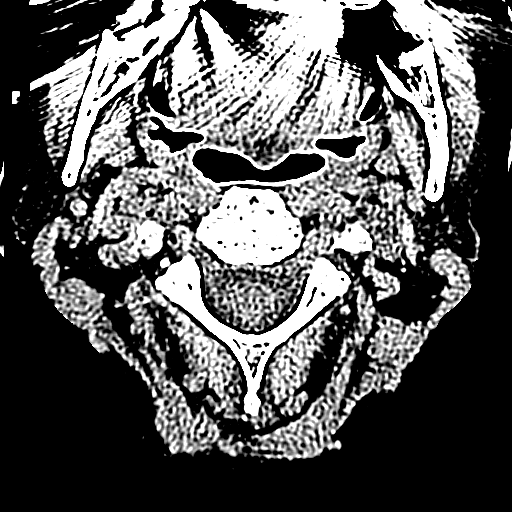
[im 81/101  brain]
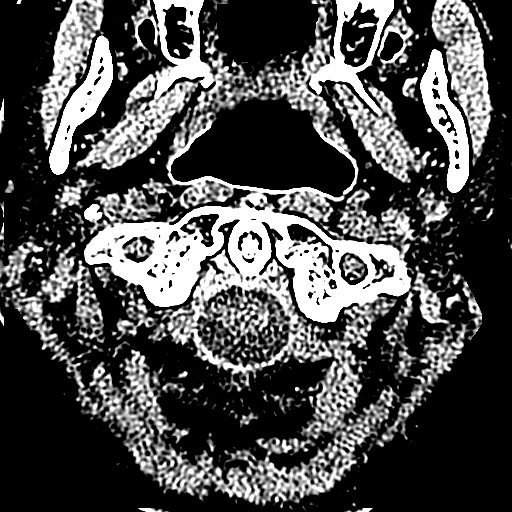
[im 91/101  brain]
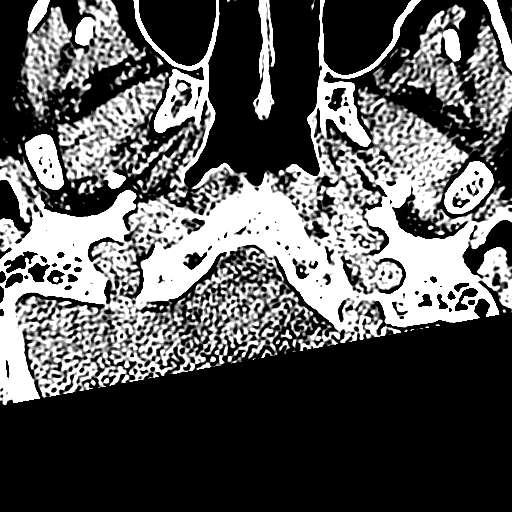
[im 91/101  bone]
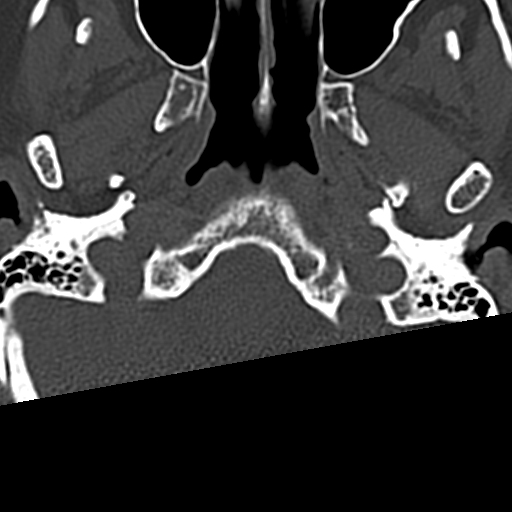

[14 of 30 positions shown; findings below may reference images not displayed]

FINDINGS: CT HEAD FINDINGS

Severe hydrocephalus has slowly worsened comparing prior MRI is
dated 7113 and 0480.

No acute hemorrhage. No midline shift. Chronic ischemic changes.
Global atrophy. Mastoid air cells are clear. No skull fracture.

CT CERVICAL SPINE FINDINGS

No fracture. No dislocation. Anatomic alignment. Severe multilevel
disc space narrowing.
IMPRESSION: Severe intracranial hydrocephalus as slowly worsened dating back to
0480. Normal pressure hydrocephalus is not excluded. No acute
injury.

No evidence of acute cervical spine injury.
